# Patient Record
Sex: Female | Born: 1990 | Race: White | Hispanic: No | Marital: Married | Smoking: Current every day smoker
Health system: Southern US, Community
[De-identification: ages and names within clinical notes are randomized; demographics above are authoritative.]

## PROBLEM LIST (undated history)

## (undated) DIAGNOSIS — I10 Essential (primary) hypertension: Secondary | ICD-10-CM

## (undated) DIAGNOSIS — K219 Gastro-esophageal reflux disease without esophagitis: Secondary | ICD-10-CM

## (undated) DIAGNOSIS — J45909 Unspecified asthma, uncomplicated: Secondary | ICD-10-CM

## (undated) DIAGNOSIS — N200 Calculus of kidney: Secondary | ICD-10-CM

## (undated) DIAGNOSIS — D649 Anemia, unspecified: Secondary | ICD-10-CM

## (undated) DIAGNOSIS — F410 Panic disorder [episodic paroxysmal anxiety] without agoraphobia: Secondary | ICD-10-CM

## (undated) DIAGNOSIS — Z87442 Personal history of urinary calculi: Secondary | ICD-10-CM

## (undated) HISTORY — PX: CHOLECYSTECTOMY: SHX55

## (undated) HISTORY — PX: KIDNEY STONE SURGERY: SHX686

---
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
## 2010-01-06 ENCOUNTER — Emergency Department: Payer: Self-pay | Admitting: Emergency Medicine

## 2010-02-12 ENCOUNTER — Emergency Department: Payer: Self-pay | Admitting: Emergency Medicine

## 2010-02-16 ENCOUNTER — Emergency Department: Payer: Self-pay | Admitting: Emergency Medicine

## 2010-03-29 ENCOUNTER — Emergency Department: Payer: Self-pay | Admitting: Emergency Medicine

## 2010-04-20 ENCOUNTER — Emergency Department: Payer: Self-pay | Admitting: Unknown Physician Specialty

## 2010-04-23 ENCOUNTER — Emergency Department: Payer: Self-pay | Admitting: Emergency Medicine

## 2010-05-19 ENCOUNTER — Observation Stay: Payer: Self-pay | Admitting: Obstetrics & Gynecology

## 2010-07-30 ENCOUNTER — Observation Stay: Payer: Self-pay | Admitting: Obstetrics and Gynecology

## 2010-08-17 ENCOUNTER — Encounter: Payer: Self-pay | Admitting: Obstetrics & Gynecology

## 2010-08-18 ENCOUNTER — Encounter: Payer: Self-pay | Admitting: Obstetrics & Gynecology

## 2010-09-11 ENCOUNTER — Ambulatory Visit: Payer: Self-pay | Admitting: Obstetrics and Gynecology

## 2010-09-12 ENCOUNTER — Inpatient Hospital Stay: Payer: Self-pay | Admitting: Obstetrics and Gynecology

## 2010-10-19 ENCOUNTER — Emergency Department: Payer: Self-pay | Admitting: Emergency Medicine

## 2010-11-11 ENCOUNTER — Emergency Department: Payer: Self-pay | Admitting: Emergency Medicine

## 2010-11-18 ENCOUNTER — Emergency Department: Payer: Self-pay | Admitting: Emergency Medicine

## 2010-12-09 NOTE — Assessment & Plan Note (Signed)
Elizabeth Shea, Elizabeth Shea                   ACCOUNT NO.:  [id number]   MEDICAL RECORD NO.:  [id number]          PATIENT TYPE:  POB   LOCATION:  [facility] at [facility]         FACILITY:  [facility]   PHYSICIAN:  Johnella Moloney, MD        DATE OF BIRTH:  February 23, 1991   DATE OF SERVICE:  04/17/2008                                  CLINIC NOTE   CHIEF COMPLAINT:  Dysfunctional uterine bleeding, abdominal pain.   HISTORY OF PRESENT ILLNESS:  The patient is a 20 year old gravida 2,  para 2-0-0-2 with a last menstrual period of April 10, 2008, who is  here for two main issues.  Issue #1 is dysfunctional uterine bleeding.  The patient reports having menarche at age 46 and has had regular  menstrual periods excluding the time that she was pregnant.  However,  about 2 weeks ago, she noted that she was having bleeding only in the  morning for 1 week, which goes away by the middle of the day.  The  bleeding is kind of like a period.  This is not part of her regular  menstrual bleeding.  Of note, the patient is on Loestrin 24 for birth  control and has been on this since her last delivery 5 months ago.  She  also states that this bleeding is associated with some nausea and  vomiting and also some pelvic pain.  The patient is interested in  changing her Loestrin birth control method and is interested in the  Implanon.  Next issue is abdominal pain.  The patient does report that  sometimes when she eats, she feels very sick and feels very dizzy and  then she gets a panic attack, which leads her to get cramping.  She was  evaluated in [facility] for this and had an extensive amount of tests  done including a Pap smear, STD testing, and other modalities, which  were all negative.  The patient denied any gastroenterology workup or  psychiatric workup given her history of panic attacks.  The patient does  state that she will be seeing a psychotherapist in about a week because  she feels like her nerves are causing her  stomach pain.  The patient  is requesting an ultrasound just to make sure that everything is okay  because this was not done in [place].  The patient is sexually  active with the father of her baby does not report any problems, no  sexually transmitted infections, or any other concerns.  The patient did  start the Gardasil vaccine series prior to pregnancy, only received one  shot, and is interested in restarting the series.   PAST OB/GYN HISTORY:  Menarche at age 20.  Regular menstrual cycles with  30 days between cycles.  Her periods last for 7 days.  No intermenstrual  bleedings apart from what happened 2 weeks ago.  Her period usually have  mild pain associated with it.  The patient has had 2 cesarean sections.  Her last pregnancy was 7 months ago.  She is currently on Loestrin 24  for birth control.  The patient states that her last Pap smear  was on  April 11, 2008 in [place].  She denies any abnormal Pap smears.  The patient denies any sexually transmitted infections.   PAST MEDICAL HISTORY:  Asthma, bipolar disease, ADHD, and anxiety  attacks.  The patient is not on any medications for her psychiatric  issues.   PAST SURGICAL HISTORY:  Cesarean section x2.   MEDICATIONS:  Loestrin 24 Fe 1 tablet p.o. q.d. and metronidazole 500 mg  p.o. q.d.  The patient stated that she was given this at [facility] for  treatment for possible bacterial vaginosis.   ALLERGIES:  No known drug allergies.   SOCIAL HISTORY:  The patient lives with her children and with her sister  and mother.  She does not work.  She smokes half a pack a day and has  smoked for 2 years.  She does not drink any alcohol.  She has used  addicting drugs in the past and did not divulge with drugs she used, but  is not currently using any drugs according to her.  She denies any  history of sexual or physical abuse.   FAMILY HISTORY:  The patient reports that her maternal grandmother had  breast cancer at an old  age and her mother has high blood pressure.   REVIEW OF SYSTEMS:  She endorses weight loss, dizzy spells, problems of  breathing, nausea, vomiting, and vaginal bleeding.   PHYSICAL EXAMINATION:  VITAL SIGNS:  Blood pressure is 91/68, pulse 68,  weight 153 pounds, height 5 feet 3 inches.  GENERAL:  No apparent distress.  BREASTS:  There was a 7-mm mobile mass that was palpated at 10 o'clock  position of her right breast.  This mass is nontender and it is smooth.  No other masses palpated on the right breast.  No skin changes.  No  lymphadenopathy.  No abnormal drainage.  Normal examination on the  patient's left breast.  LUNGS:  Clear to auscultation bilaterally.  HEART:  Regular rate and rhythm.  ABDOMEN:  Nontender, nondistended.  The patient has two transverse  incisions, one Pfannenstiel  and the one slightly above it, which are  well healed.  EXTREMITIES:  No cyanosis, clubbing, or edema.  PELVIC:  Normal external female genitalia.  Speculum exam was deferred.  On bimanual exam, small retroverted uterus.  No tenderness.  No adnexal  masses or tenderness elicited on examination.   ASSESSMENT AND PLAN:  The patient is a 20 year old gravida 2, para 2,  here with two complaints.  For her dysfunctional uterine bleeding, this  is likely a side effect of her birth control pills.  The patient is  interested in the Implanon.  She was given information to review at home  for the Implanon.  Also, given information for Mirena IUD.  The patient  was also given information for Gardasil and encouraged to make an  appointment to complete the series.  An appointment will be made for her  for Implanon insertion in this clinic.  As for her pelvic pain, the  patient attributes this to her nerves.  There is no etiology that was  found on examination.  We will schedule a pelvic ultrasound just for  confirmation of normal pelvic anatomy as per patient request.  The  patient was told that if these  symptoms continue, she will need  psychiatric evaluation versus gastroenterology workup for irritable  bowel syndrome.  The patient was also noted to have a  breast mass on  examination.  This is likely  benign.  It is likely a fibroadenoma versus  clogged duct.  That said, a breast ultrasound will be ordered just for  further evaluation and characterization of this abnormal mass.  We will  follow up the results.  The patient will follow up for her Implanon  insertion and discussion of all these results and for followup of her  symptoms.           ______________________________  Johnella Moloney, MD     UD/MEDQ  D:  04/17/2008  T:  04/18/2008  Job:  [id number]

## 2011-01-30 ENCOUNTER — Emergency Department: Payer: Self-pay | Admitting: Emergency Medicine

## 2011-02-04 ENCOUNTER — Emergency Department: Payer: Self-pay | Admitting: Emergency Medicine

## 2011-04-13 ENCOUNTER — Emergency Department: Payer: Self-pay | Admitting: Unknown Physician Specialty

## 2011-05-31 ENCOUNTER — Emergency Department: Payer: Self-pay | Admitting: Emergency Medicine

## 2011-06-27 ENCOUNTER — Emergency Department: Payer: Self-pay | Admitting: Emergency Medicine

## 2011-10-21 ENCOUNTER — Ambulatory Visit: Payer: Self-pay | Admitting: Obstetrics and Gynecology

## 2011-11-14 ENCOUNTER — Emergency Department: Payer: Self-pay | Admitting: Internal Medicine

## 2011-11-14 LAB — URINALYSIS, COMPLETE
Glucose,UR: NEGATIVE mg/dL (ref 0–75)
Ketone: NEGATIVE
Nitrite: NEGATIVE
Ph: 6 (ref 4.5–8.0)
Protein: 30
RBC,UR: 97 /HPF (ref 0–5)
Specific Gravity: 1.018 (ref 1.003–1.030)
Squamous Epithelial: 586
WBC UR: 41 /HPF (ref 0–5)

## 2011-11-14 LAB — BASIC METABOLIC PANEL
BUN: 11 mg/dL (ref 7–18)
Calcium, Total: 9 mg/dL (ref 8.5–10.1)
Chloride: 106 mmol/L (ref 98–107)
Co2: 25 mmol/L (ref 21–32)
Creatinine: 0.68 mg/dL (ref 0.60–1.30)
EGFR (African American): >60
EGFR (Non-African Amer.): >60
Glucose: 74 mg/dL (ref 65–99)
Osmolality: 277 (ref 275–301)
Potassium: 4 mmol/L (ref 3.5–5.1)

## 2011-11-14 LAB — CBC WITH DIFFERENTIAL/PLATELET
Basophil %: 0.4 %
Eosinophil #: 0.2 10*3/uL (ref 0.0–0.7)
Eosinophil %: 2.2 %
HGB: 14 g/dL (ref 12.0–16.0)
Lymphocyte #: 2.7 10*3/uL (ref 1.0–3.6)
MCHC: 33.5 g/dL (ref 32.0–36.0)
MCV: 93 fL (ref 80–100)
Neutrophil #: 5.4 10*3/uL (ref 1.4–6.5)
Neutrophil %: 59.9 %
Platelet: 192 10*3/uL (ref 150–440)

## 2011-11-21 ENCOUNTER — Emergency Department: Payer: Self-pay | Admitting: *Deleted

## 2012-01-20 ENCOUNTER — Emergency Department: Payer: Self-pay

## 2012-01-20 LAB — URINALYSIS, COMPLETE
Bacteria: NONE SEEN
Bilirubin,UR: NEGATIVE
Glucose,UR: NEGATIVE mg/dL (ref 0–75)
Renal Epithelial: 1
WBC UR: 2 /HPF (ref 0–5)

## 2012-01-20 LAB — COMPREHENSIVE METABOLIC PANEL
Albumin: 4.2 g/dL (ref 3.4–5.0)
Anion Gap: 4 — ABNORMAL LOW (ref 7–16)
Bilirubin,Total: 0.4 mg/dL (ref 0.2–1.0)
Calcium, Total: 9.3 mg/dL (ref 8.5–10.1)
Chloride: 106 mmol/L (ref 98–107)
Co2: 27 mmol/L (ref 21–32)
Creatinine: 0.82 mg/dL (ref 0.60–1.30)
EGFR (African American): >60
EGFR (Non-African Amer.): >60
Glucose: 115 mg/dL — ABNORMAL HIGH (ref 65–99)
Osmolality: 276 (ref 275–301)
Potassium: 4 mmol/L (ref 3.5–5.1)
Sodium: 137 mmol/L (ref 136–145)
Total Protein: 7.8 g/dL (ref 6.4–8.2)

## 2012-01-20 LAB — CSF CELL CT + PROT + GLU PANEL
Eosinophil: 0 %
Glucose, CSF: 67 mg/dL (ref 40–75)
Lymphocytes: 80 %
Monocytes/Macrophages: 20 %
Neutrophils: 0 %
Other Cells: 0 %
RBC (CSF): 5 /mm3

## 2012-01-20 LAB — CSF CELL COUNT WITH DIFFERENTIAL
CSF Tube #: 3
Eosinophil: 0 %
Monocytes/Macrophages: 0 %
Neutrophils: 0 %
RBC (CSF): 18 /mm3

## 2012-01-20 LAB — CBC
HCT: 42.3 % (ref 35.0–47.0)
MCH: 31.4 pg (ref 26.0–34.0)
MCV: 92 fL (ref 80–100)
Platelet: 207 10*3/uL (ref 150–440)
RBC: 4.61 10*6/uL (ref 3.80–5.20)
RDW: 12.8 % (ref 11.5–14.5)

## 2012-01-20 LAB — DRUG SCREEN, URINE
Amphetamines, Ur Screen: NEGATIVE (ref ?–1000)
Barbiturates, Ur Screen: NEGATIVE (ref ?–200)
Benzodiazepine, Ur Scrn: NEGATIVE (ref ?–200)
Cannabinoid 50 Ng, Ur ~~LOC~~: POSITIVE (ref ?–50)
Cocaine Metabolite,Ur ~~LOC~~: NEGATIVE (ref ?–300)
Tricyclic, Ur Screen: NEGATIVE (ref ?–1000)

## 2012-01-20 LAB — LIPASE, BLOOD: Lipase: 85 U/L (ref 73–393)

## 2012-01-20 LAB — ETHANOL
Ethanol %: <0.003 % (ref 0.000–0.080)
Ethanol: <3 mg/dL

## 2012-01-20 LAB — TSH: Thyroid Stimulating Horm: 1.95 u[IU]/mL

## 2012-01-20 LAB — PREGNANCY, URINE: Pregnancy Test, Urine: NEGATIVE m[IU]/mL

## 2012-01-29 ENCOUNTER — Ambulatory Visit: Payer: Self-pay | Admitting: Neurology

## 2012-03-07 ENCOUNTER — Emergency Department: Payer: Self-pay | Admitting: Emergency Medicine

## 2012-03-07 LAB — URINALYSIS, COMPLETE
Glucose,UR: NEGATIVE mg/dL (ref 0–75)
Nitrite: NEGATIVE
Ph: 5 (ref 4.5–8.0)

## 2012-03-09 LAB — URINE CULTURE

## 2012-03-10 ENCOUNTER — Emergency Department: Payer: Self-pay | Admitting: Emergency Medicine

## 2012-03-10 LAB — URINALYSIS, COMPLETE
Ketone: NEGATIVE
Nitrite: NEGATIVE
Ph: 8 (ref 4.5–8.0)
Protein: 100
Specific Gravity: 1.018 (ref 1.003–1.030)
Transitional Epi: 5

## 2012-04-08 ENCOUNTER — Emergency Department: Payer: Self-pay | Admitting: Emergency Medicine

## 2012-04-08 LAB — CBC WITH DIFFERENTIAL/PLATELET
Basophil #: 0.1 10*3/uL (ref 0.0–0.1)
Eosinophil #: 0.1 10*3/uL (ref 0.0–0.7)
HCT: 36.5 % (ref 35.0–47.0)
HGB: 13 g/dL (ref 12.0–16.0)
MCH: 32.3 pg (ref 26.0–34.0)
MCV: 91 fL (ref 80–100)
Neutrophil #: 6.1 10*3/uL (ref 1.4–6.5)
RBC: 4.02 10*6/uL (ref 3.80–5.20)

## 2012-04-08 LAB — BASIC METABOLIC PANEL
Anion Gap: 12 (ref 7–16)
Calcium, Total: 8.5 mg/dL (ref 8.5–10.1)
Chloride: 110 mmol/L — ABNORMAL HIGH (ref 98–107)
Co2: 23 mmol/L (ref 21–32)
Creatinine: 0.65 mg/dL (ref 0.60–1.30)

## 2012-06-20 ENCOUNTER — Emergency Department: Payer: Self-pay | Admitting: Unknown Physician Specialty

## 2012-06-20 LAB — COMPREHENSIVE METABOLIC PANEL
Albumin: 4 g/dL (ref 3.4–5.0)
Alkaline Phosphatase: 89 U/L (ref 50–136)
Bilirubin,Total: 0.4 mg/dL (ref 0.2–1.0)
Chloride: 107 mmol/L (ref 98–107)
Glucose: 81 mg/dL (ref 65–99)
Osmolality: 277 (ref 275–301)
Potassium: 3.8 mmol/L (ref 3.5–5.1)
Total Protein: 7.4 g/dL (ref 6.4–8.2)

## 2012-06-20 LAB — URINALYSIS, COMPLETE
Glucose,UR: NEGATIVE mg/dL (ref 0–75)
Ketone: NEGATIVE
Nitrite: NEGATIVE
WBC UR: 4 /HPF (ref 0–5)

## 2012-06-20 LAB — CBC
MCV: 91 fL (ref 80–100)
Platelet: 221 10*3/uL (ref 150–440)
RBC: 4.31 10*6/uL (ref 3.80–5.20)
WBC: 9.8 10*3/uL (ref 3.6–11.0)

## 2012-06-21 LAB — LIPASE, BLOOD: Lipase: 98 U/L (ref 73–393)

## 2012-10-09 ENCOUNTER — Emergency Department: Payer: Self-pay | Admitting: Emergency Medicine

## 2012-10-23 ENCOUNTER — Emergency Department: Payer: Self-pay | Admitting: Emergency Medicine

## 2013-04-21 ENCOUNTER — Emergency Department: Payer: Self-pay | Admitting: Emergency Medicine

## 2013-04-21 LAB — URINALYSIS, COMPLETE
Bilirubin,UR: NEGATIVE
Glucose,UR: NEGATIVE mg/dL (ref 0–75)
Leukocyte Esterase: NEGATIVE
Nitrite: NEGATIVE
RBC,UR: 18 /HPF (ref 0–5)
Specific Gravity: 1.017 (ref 1.003–1.030)
Squamous Epithelial: 4
WBC UR: 1 /HPF (ref 0–5)

## 2013-11-17 ENCOUNTER — Emergency Department: Payer: Self-pay | Admitting: Emergency Medicine

## 2013-11-17 LAB — CBC
HCT: 43 % (ref 35.0–47.0)
HGB: 14.5 g/dL (ref 12.0–16.0)
MCH: 31.3 pg (ref 26.0–34.0)
MCHC: 33.7 g/dL (ref 32.0–36.0)
MCV: 93 fL (ref 80–100)
PLATELETS: 219 10*3/uL (ref 150–440)
RBC: 4.62 10*6/uL (ref 3.80–5.20)
RDW: 13.8 % (ref 11.5–14.5)
WBC: 11.8 10*3/uL — ABNORMAL HIGH (ref 3.6–11.0)

## 2013-11-17 LAB — WET PREP, GENITAL

## 2013-11-17 LAB — HCG, QUANTITATIVE, PREGNANCY: BETA HCG, QUANT.: 37 m[IU]/mL — AB

## 2014-06-21 ENCOUNTER — Emergency Department: Payer: Self-pay | Admitting: Emergency Medicine

## 2014-07-05 ENCOUNTER — Emergency Department: Payer: Self-pay | Admitting: Student

## 2014-07-26 ENCOUNTER — Emergency Department: Payer: Self-pay | Admitting: Emergency Medicine

## 2014-07-26 LAB — BASIC METABOLIC PANEL
Anion Gap: 9 (ref 7–16)
BUN: 14 mg/dL (ref 7–18)
Calcium, Total: 8.7 mg/dL (ref 8.5–10.1)
Chloride: 112 mmol/L — ABNORMAL HIGH (ref 98–107)
Co2: 20 mmol/L — ABNORMAL LOW (ref 21–32)
Creatinine: 0.71 mg/dL (ref 0.60–1.30)
EGFR (African American): >60
GLUCOSE: 100 mg/dL — AB (ref 65–99)
Osmolality: 282 (ref 275–301)
Potassium: 3.9 mmol/L (ref 3.5–5.1)
SODIUM: 141 mmol/L (ref 136–145)

## 2014-07-26 LAB — CBC WITH DIFFERENTIAL/PLATELET
Basophil #: 0 10*3/uL (ref 0.0–0.1)
Basophil %: 0.3 %
EOS ABS: 0 10*3/uL (ref 0.0–0.7)
Eosinophil %: 0.3 %
HCT: 44.3 % (ref 35.0–47.0)
HGB: 14.7 g/dL (ref 12.0–16.0)
LYMPHS ABS: 2.1 10*3/uL (ref 1.0–3.6)
Lymphocyte %: 19.9 %
MCH: 30.8 pg (ref 26.0–34.0)
MCHC: 33.2 g/dL (ref 32.0–36.0)
MCV: 93 fL (ref 80–100)
MONO ABS: 0.5 x10 3/mm (ref 0.2–0.9)
Monocyte %: 4.3 %
NEUTROS PCT: 75.2 %
Neutrophil #: 8 10*3/uL — ABNORMAL HIGH (ref 1.4–6.5)
Platelet: 229 10*3/uL (ref 150–440)
RBC: 4.77 10*6/uL (ref 3.80–5.20)
RDW: 12.6 % (ref 11.5–14.5)
WBC: 10.6 10*3/uL (ref 3.6–11.0)

## 2014-07-26 LAB — URINALYSIS, COMPLETE
Bacteria: NONE SEEN
Bilirubin,UR: NEGATIVE
Glucose,UR: NEGATIVE mg/dL (ref 0–75)
Ketone: NEGATIVE
Leukocyte Esterase: NEGATIVE
Nitrite: NEGATIVE
PH: 6 (ref 4.5–8.0)
Protein: NEGATIVE
RBC,UR: 545 /HPF (ref 0–5)
SPECIFIC GRAVITY: 1.02 (ref 1.003–1.030)
Squamous Epithelial: 2
WBC UR: NONE SEEN /HPF (ref 0–5)

## 2014-07-26 LAB — TROPONIN I: Troponin-I: <0.02 ng/mL

## 2014-10-12 ENCOUNTER — Emergency Department: Payer: Self-pay | Admitting: Emergency Medicine

## 2014-12-03 ENCOUNTER — Encounter: Payer: Self-pay | Admitting: *Deleted

## 2014-12-03 DIAGNOSIS — Z3202 Encounter for pregnancy test, result negative: Secondary | ICD-10-CM | POA: Diagnosis not present

## 2014-12-03 DIAGNOSIS — S80211A Abrasion, right knee, initial encounter: Secondary | ICD-10-CM | POA: Insufficient documentation

## 2014-12-03 DIAGNOSIS — Z720 Tobacco use: Secondary | ICD-10-CM | POA: Insufficient documentation

## 2014-12-03 DIAGNOSIS — Y998 Other external cause status: Secondary | ICD-10-CM | POA: Diagnosis not present

## 2014-12-03 DIAGNOSIS — S80212A Abrasion, left knee, initial encounter: Secondary | ICD-10-CM | POA: Diagnosis not present

## 2014-12-03 DIAGNOSIS — S0081XA Abrasion of other part of head, initial encounter: Secondary | ICD-10-CM | POA: Diagnosis not present

## 2014-12-03 DIAGNOSIS — S60511A Abrasion of right hand, initial encounter: Secondary | ICD-10-CM | POA: Insufficient documentation

## 2014-12-03 DIAGNOSIS — S60512A Abrasion of left hand, initial encounter: Secondary | ICD-10-CM | POA: Diagnosis not present

## 2014-12-03 DIAGNOSIS — W1839XA Other fall on same level, initial encounter: Secondary | ICD-10-CM | POA: Diagnosis not present

## 2014-12-03 DIAGNOSIS — S70211A Abrasion, right hip, initial encounter: Secondary | ICD-10-CM | POA: Insufficient documentation

## 2014-12-03 DIAGNOSIS — S0990XA Unspecified injury of head, initial encounter: Secondary | ICD-10-CM | POA: Diagnosis present

## 2014-12-03 DIAGNOSIS — Y9289 Other specified places as the place of occurrence of the external cause: Secondary | ICD-10-CM | POA: Diagnosis not present

## 2014-12-03 DIAGNOSIS — Y9389 Activity, other specified: Secondary | ICD-10-CM | POA: Diagnosis not present

## 2014-12-03 DIAGNOSIS — S3992XA Unspecified injury of lower back, initial encounter: Secondary | ICD-10-CM | POA: Diagnosis not present

## 2014-12-03 DIAGNOSIS — S30811A Abrasion of abdominal wall, initial encounter: Secondary | ICD-10-CM | POA: Insufficient documentation
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
## 2014-12-04 ENCOUNTER — Emergency Department
Admission: EM | Admit: 2014-12-04 | Discharge: 2014-12-04 | Disposition: A | Discharge status: Home / Self Care | Payer: Medicaid Other | Attending: Emergency Medicine | Admitting: Emergency Medicine

## 2014-12-04 ENCOUNTER — Encounter: Payer: Self-pay | Admitting: Emergency Medicine

## 2014-12-04 ENCOUNTER — Emergency Department: Payer: Medicaid Other

## 2014-12-04 DIAGNOSIS — T07XXXA Unspecified multiple injuries, initial encounter: Secondary | ICD-10-CM

## 2014-12-04 DIAGNOSIS — M7918 Myalgia, other site: Secondary | ICD-10-CM

## 2014-12-04 HISTORY — DX: Unspecified asthma, uncomplicated: J45.909

## 2014-12-04 LAB — URINALYSIS COMPLETE WITH MICROSCOPIC (ARMC ONLY)
Bilirubin Urine: NEGATIVE
Glucose, UA: NEGATIVE mg/dL
Hgb urine dipstick: NEGATIVE
LEUKOCYTES UA: NEGATIVE
NITRITE: NEGATIVE
PH: 7 (ref 5.0–8.0)
PROTEIN: NEGATIVE mg/dL
RBC / HPF: NONE SEEN RBC/hpf (ref 0–5)
SPECIFIC GRAVITY, URINE: 1.01 (ref 1.005–1.030)

## 2014-12-04 LAB — BASIC METABOLIC PANEL
ANION GAP: 8 (ref 5–15)
BUN: 15 mg/dL (ref 6–20)
CHLORIDE: 108 mmol/L (ref 101–111)
CO2: 23 mmol/L (ref 22–32)
Calcium: 9.3 mg/dL (ref 8.9–10.3)
Creatinine, Ser: 0.7 mg/dL (ref 0.44–1.00)
GFR calc Af Amer: >60 mL/min (ref >60)
GFR calc non Af Amer: >60 mL/min (ref >60)
Glucose, Bld: 88 mg/dL (ref 65–99)
Potassium: 3.7 mmol/L (ref 3.5–5.1)
Sodium: 139 mmol/L (ref 135–145)

## 2014-12-04 LAB — CBC
HCT: 42.8 % (ref 35.0–47.0)
Hemoglobin: 14 g/dL (ref 12.0–16.0)
MCH: 30.8 pg (ref 26.0–34.0)
MCHC: 32.8 g/dL (ref 32.0–36.0)
MCV: 93.8 fL (ref 80.0–100.0)
Platelets: 233 10*3/uL (ref 150–440)
RBC: 4.56 MIL/uL (ref 3.80–5.20)
RDW: 13.2 % (ref 11.5–14.5)
WBC: 14.3 10*3/uL — AB (ref 3.6–11.0)

## 2014-12-04 LAB — HCG, QUANTITATIVE, PREGNANCY: hCG, Beta Chain, Quant, S: <1 m[IU]/mL (ref <5)

## 2014-12-04 MED ORDER — ONDANSETRON HCL 4 MG/2ML IJ SOLN
INTRAMUSCULAR | Status: AC
  Filled 2014-12-04: qty 2

## 2014-12-04 MED ORDER — OXYCODONE-ACETAMINOPHEN 5-325 MG PO TABS
1.0000 | ORAL_TABLET | Freq: Once | ORAL | Status: AC
  Administered 2014-12-04: 1 via ORAL

## 2014-12-04 MED ORDER — MORPHINE SULFATE 4 MG/ML IJ SOLN
4.0000 mg | Freq: Once | INTRAMUSCULAR | Status: AC
  Administered 2014-12-04: 4 mg via INTRAVENOUS

## 2014-12-04 MED ORDER — IOHEXOL 300 MG/ML  SOLN
100.0000 mL | Freq: Once | INTRAMUSCULAR | Status: AC | PRN
  Administered 2014-12-04: 100 mL via INTRAVENOUS

## 2014-12-04 MED ORDER — OXYCODONE-ACETAMINOPHEN 5-325 MG PO TABS
ORAL_TABLET | ORAL | Status: AC
  Administered 2014-12-04: 1 via ORAL
  Filled 2014-12-04: qty 1

## 2014-12-04 MED ORDER — MORPHINE SULFATE 4 MG/ML IJ SOLN
INTRAMUSCULAR | Status: AC
  Administered 2014-12-04: 4 mg via INTRAVENOUS
  Filled 2014-12-04: qty 1

## 2014-12-04 MED ORDER — SODIUM CHLORIDE 0.9 % IV BOLUS (SEPSIS): 1000.0000 mL | Freq: Once | INTRAVENOUS | Status: DC

## 2014-12-04 MED ORDER — ONDANSETRON HCL 4 MG/2ML IJ SOLN
4.0000 mg | Freq: Once | INTRAMUSCULAR | Status: AC
  Administered 2014-12-04: 4 mg via INTRAVENOUS

## 2014-12-04 MED ORDER — OXYCODONE-ACETAMINOPHEN 5-325 MG PO TABS: 1.0000 | ORAL_TABLET | Freq: Four times a day (QID) | ORAL | Status: DC | PRN

## 2014-12-04 NOTE — Discharge Instructions (Signed)
Abrasion An abrasion is a cut or scrape of the skin. Abrasions do not extend through all layers of the skin and most heal within 10 days. It is important to care for your abrasion properly to prevent infection. CAUSES  Most abrasions are caused by falling on, or gliding across, the ground or other surface. When your skin rubs on something, the outer and inner layer of skin rubs off, causing an abrasion. DIAGNOSIS  Your caregiver will be able to diagnose an abrasion during a physical exam.  TREATMENT  Your treatment depends on how large and deep the abrasion is. Generally, your abrasion will be cleaned with water and a mild soap to remove any dirt or debris. An antibiotic ointment may be put over the abrasion to prevent an infection. A bandage (dressing) may be wrapped around the abrasion to keep it from getting dirty.  You may need a tetanus shot if:  You cannot remember when you had your last tetanus shot.  You have never had a tetanus shot.  The injury broke your skin. If you get a tetanus shot, your arm may swell, get red, and feel warm to the touch. This is common and not a problem. If you need a tetanus shot and you choose not to have one, there is a rare chance of getting tetanus. Sickness from tetanus can be serious.  HOME CARE INSTRUCTIONS   If a dressing was applied, change it at least once a day or as directed by your caregiver. If the bandage sticks, soak it off with warm water.   Wash the area with water and a mild soap to remove all the ointment 2 times a day. Rinse off the soap and pat the area dry with a clean towel.   Reapply any ointment as directed by your caregiver. This will help prevent infection and keep the bandage from sticking. Use gauze over the wound and under the dressing to help keep the bandage from sticking.   Change your dressing right away if it becomes wet or dirty.   Only take over-the-counter or prescription medicines for pain, discomfort, or fever as  directed by your caregiver.   Follow up with your caregiver within 24-48 hours for a wound check, or as directed. If you were not given a wound-check appointment, look closely at your abrasion for redness, swelling, or pus. These are signs of infection. SEEK IMMEDIATE MEDICAL CARE IF:   You have increasing pain in the wound.   You have redness, swelling, or tenderness around the wound.   You have pus coming from the wound.   You have a fever or persistent symptoms for more than 2-3 days.  You have a fever and your symptoms suddenly get worse.  You have a bad smell coming from the wound or dressing.  MAKE SURE YOU:   Understand these instructions.  Will watch your condition.  Will get help right away if you are not doing well or get worse. Document Released: 04/22/2005 Document Revised: 06/29/2012 Document Reviewed: 06/16/2011 [facility] Patient Information 2015 [place], [place]. This information is not intended to replace advice given to you by your health care provider. Make sure you discuss any questions you have with your health care provider.  Muscle Pain Muscle pain (myalgia) may be caused by many things, including:  Overuse or muscle strain, especially if you are not in shape. This is the most common cause of muscle pain.  Injury.  Bruises.  Viruses, such as the flu.  Infectious  diseases.  Fibromyalgia, which is a chronic condition that causes muscle tenderness, fatigue, and headache.  Autoimmune diseases, including lupus.  Certain drugs, including ACE inhibitors and statins. Muscle pain may be mild or severe. In most cases, the pain lasts only a short time and goes away without treatment. To diagnose the cause of your muscle pain, your health care provider will take your medical history. This means he or she will ask you when your muscle pain began and what has been happening. If you have not had muscle pain for very long, your health care provider may want to  wait before doing much testing. If your muscle pain has lasted a long time, your health care provider may want to run tests right away. If your health care provider thinks your muscle pain may be caused by illness, you may need to have additional tests to rule out certain conditions.  Treatment for muscle pain depends on the cause. Home care is often enough to relieve muscle pain. Your health care provider may also prescribe anti-inflammatory medicine. HOME CARE INSTRUCTIONS Watch your condition for any changes. The following actions may help to lessen any discomfort you are feeling:  Only take over-the-counter or prescription medicines as directed by your health care provider.  Apply ice to the sore muscle:  Put ice in a plastic bag.  Place a towel between your skin and the bag.  Leave the ice on for 15-20 minutes, 3-4 times a day.  You may alternate applying hot and cold packs to the muscle as directed by your health care provider.  If overuse is causing your muscle pain, slow down your activities until the pain goes away.  Remember that it is normal to feel some muscle pain after starting a workout program. Muscles that have not been used often will be sore at first.  Do regular, gentle exercises if you are not usually active.  Warm up before exercising to lower your risk of muscle pain.  Do not continue working out if the pain is very bad. Bad pain could mean you have injured a muscle. SEEK MEDICAL CARE IF:  Your muscle pain gets worse, and medicines do not help.  You have muscle pain that lasts longer than 3 days.  You have a rash or fever along with muscle pain.  You have muscle pain after a tick bite.  You have muscle pain while working out, even though you are in good physical condition.  You have redness, soreness, or swelling along with muscle pain.  You have muscle pain after starting a new medicine or changing the dose of a medicine. SEEK IMMEDIATE MEDICAL CARE  IF:  You have trouble breathing.  You have trouble swallowing.  You have muscle pain along with a stiff neck, fever, and vomiting.  You have severe muscle weakness or cannot move part of your body. MAKE SURE YOU:   Understand these instructions.  Will watch your condition.  Will get help right away if you are not doing well or get worse. Document Released: 06/04/2006 Document Revised: 07/18/2013 Document Reviewed: 05/09/2013 [facility] Patient Information 2015 [place], [place]. This information is not intended to replace advice given to you by your health care provider. Make sure you discuss any questions you have with your health care provider.
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
## 2014-12-04 NOTE — ED Notes (Signed)
Pt verbalizes understanding of discharge instructions.

## 2015-04-09 ENCOUNTER — Emergency Department: Payer: Medicaid Other

## 2015-04-09 ENCOUNTER — Encounter: Payer: Self-pay | Admitting: Internal Medicine

## 2015-04-09 ENCOUNTER — Inpatient Hospital Stay
Admission: EM | Admit: 2015-04-09 | Discharge: 2015-04-11 | DRG: 100 | Disposition: A | Discharge status: Home / Self Care | Payer: Medicaid Other | Attending: Internal Medicine | Admitting: Internal Medicine

## 2015-04-09 DIAGNOSIS — T50901A Poisoning by unspecified drugs, medicaments and biological substances, accidental (unintentional), initial encounter: Secondary | ICD-10-CM | POA: Diagnosis present

## 2015-04-09 DIAGNOSIS — Z9911 Dependence on respirator [ventilator] status: Secondary | ICD-10-CM

## 2015-04-09 DIAGNOSIS — G92 Toxic encephalopathy: Secondary | ICD-10-CM | POA: Diagnosis present

## 2015-04-09 DIAGNOSIS — E876 Hypokalemia: Secondary | ICD-10-CM | POA: Diagnosis present

## 2015-04-09 DIAGNOSIS — J9600 Acute respiratory failure, unspecified whether with hypoxia or hypercapnia: Secondary | ICD-10-CM | POA: Diagnosis present

## 2015-04-09 DIAGNOSIS — R569 Unspecified convulsions: Secondary | ICD-10-CM | POA: Diagnosis present

## 2015-04-09 DIAGNOSIS — G9340 Encephalopathy, unspecified: Secondary | ICD-10-CM | POA: Diagnosis not present

## 2015-04-09 DIAGNOSIS — F17210 Nicotine dependence, cigarettes, uncomplicated: Secondary | ICD-10-CM | POA: Diagnosis present

## 2015-04-09 DIAGNOSIS — R410 Disorientation, unspecified: Secondary | ICD-10-CM

## 2015-04-09 DIAGNOSIS — Z79891 Long term (current) use of opiate analgesic: Secondary | ICD-10-CM

## 2015-04-09 DIAGNOSIS — G8929 Other chronic pain: Secondary | ICD-10-CM | POA: Diagnosis present

## 2015-04-09 DIAGNOSIS — M25569 Pain in unspecified knee: Secondary | ICD-10-CM | POA: Diagnosis present

## 2015-04-09 DIAGNOSIS — G40409 Other generalized epilepsy and epileptic syndromes, not intractable, without status epilepticus: Secondary | ICD-10-CM | POA: Diagnosis present

## 2015-04-09 DIAGNOSIS — J45909 Unspecified asthma, uncomplicated: Secondary | ICD-10-CM | POA: Diagnosis present

## 2015-04-09 DIAGNOSIS — Z79899 Other long term (current) drug therapy: Secondary | ICD-10-CM

## 2015-04-09 DIAGNOSIS — F1210 Cannabis abuse, uncomplicated: Secondary | ICD-10-CM

## 2015-04-09 DIAGNOSIS — F1910 Other psychoactive substance abuse, uncomplicated: Secondary | ICD-10-CM | POA: Diagnosis present

## 2015-04-09 DIAGNOSIS — F1110 Opioid abuse, uncomplicated: Secondary | ICD-10-CM

## 2015-04-09 DIAGNOSIS — Z01818 Encounter for other preprocedural examination: Secondary | ICD-10-CM

## 2015-04-09 LAB — COMPREHENSIVE METABOLIC PANEL
ALBUMIN: 4.6 g/dL (ref 3.5–5.0)
ALK PHOS: 89 U/L (ref 38–126)
ALT: 18 U/L (ref 14–54)
AST: 47 U/L — AB (ref 15–41)
Anion gap: 16 — ABNORMAL HIGH (ref 5–15)
BUN: 12 mg/dL (ref 6–20)
CALCIUM: 9.5 mg/dL (ref 8.9–10.3)
CO2: 14 mmol/L — ABNORMAL LOW (ref 22–32)
CREATININE: 0.8 mg/dL (ref 0.44–1.00)
Chloride: 108 mmol/L (ref 101–111)
GFR calc Af Amer: >60 mL/min (ref >60)
GLUCOSE: 127 mg/dL — AB (ref 65–99)
Potassium: 3.5 mmol/L (ref 3.5–5.1)
Sodium: 138 mmol/L (ref 135–145)
TOTAL PROTEIN: 8.1 g/dL (ref 6.5–8.1)
Total Bilirubin: 0.6 mg/dL (ref 0.3–1.2)

## 2015-04-09 LAB — BLOOD GAS, ARTERIAL
ALLENS TEST (PASS/FAIL): POSITIVE — AB
Acid-base deficit: 3.8 mmol/L — ABNORMAL HIGH (ref 0.0–2.0)
Acid-base deficit: 4.7 mmol/L — ABNORMAL HIGH (ref 0.0–2.0)
Allens test (pass/fail): POSITIVE — AB
BICARBONATE: 18.5 meq/L — AB (ref 21.0–28.0)
Bicarbonate: 20.4 mEq/L — ABNORMAL LOW (ref 21.0–28.0)
FIO2: 0.21
FIO2: 0.4
MECHANICAL RATE: 14
MECHVT: 450 mL
O2 Saturation: 98.8 %
O2 Saturation: 99.6 %
PCO2 ART: 26 mmHg — AB (ref 32.0–48.0)
PEEP: 5 cmH2O
PH ART: 7.46 — AB (ref 7.350–7.450)
PH ART: 7.35 (ref 7.350–7.450)
Patient temperature: 37
Patient temperature: 37
RATE: 14 resp/min
pCO2 arterial: 37 mmHg (ref 32.0–48.0)
pO2, Arterial: 117 mmHg — ABNORMAL HIGH (ref 83.0–108.0)
pO2, Arterial: 183 mmHg — ABNORMAL HIGH (ref 83.0–108.0)

## 2015-04-09 LAB — CBC WITH DIFFERENTIAL/PLATELET
Basophils Absolute: 0 10*3/uL (ref 0–0.1)
Basophils Relative: 0 %
EOS ABS: 0 10*3/uL (ref 0–0.7)
EOS PCT: 0 %
HCT: 47.2 % — ABNORMAL HIGH (ref 35.0–47.0)
Hemoglobin: 15.5 g/dL (ref 12.0–16.0)
Lymphocytes Relative: 14 %
Lymphs Abs: 3.1 10*3/uL (ref 1.0–3.6)
MCH: 30.7 pg (ref 26.0–34.0)
MCHC: 32.9 g/dL (ref 32.0–36.0)
MCV: 93.2 fL (ref 80.0–100.0)
MONO ABS: 0.9 10*3/uL (ref 0.2–0.9)
Monocytes Relative: 4 %
Neutro Abs: 18.4 10*3/uL — ABNORMAL HIGH (ref 1.4–6.5)
Neutrophils Relative %: 82 %
PLATELETS: 312 10*3/uL (ref 150–440)
RBC: 5.06 MIL/uL (ref 3.80–5.20)
RDW: 13.2 % (ref 11.5–14.5)
WBC: 22.4 10*3/uL — AB (ref 3.6–11.0)

## 2015-04-09 LAB — URINE DRUG SCREEN, QUALITATIVE (ARMC ONLY)
Amphetamines, Ur Screen: NOT DETECTED
Barbiturates, Ur Screen: NOT DETECTED
Benzodiazepine, Ur Scrn: POSITIVE — AB
Cannabinoid 50 Ng, Ur ~~LOC~~: POSITIVE — AB
Cocaine Metabolite,Ur ~~LOC~~: NOT DETECTED
MDMA (ECSTASY) UR SCREEN: NOT DETECTED
METHADONE SCREEN, URINE: POSITIVE — AB
Opiate, Ur Screen: POSITIVE — AB
Phencyclidine (PCP) Ur S: NOT DETECTED
TRICYCLIC, UR SCREEN: NOT DETECTED

## 2015-04-09 LAB — PREGNANCY, URINE: Preg Test, Ur: NEGATIVE

## 2015-04-09 LAB — SALICYLATE LEVEL: Salicylate Lvl: <4 mg/dL (ref 2.8–30.0)

## 2015-04-09 LAB — LACTIC ACID, PLASMA: LACTIC ACID, VENOUS: 1.2 mmol/L (ref 0.5–2.0)

## 2015-04-09 LAB — ETHANOL

## 2015-04-09 MED ORDER — MIDAZOLAM HCL 2 MG/2ML IJ SOLN
2.0000 mg | Freq: Once | INTRAMUSCULAR | Status: AC
  Administered 2015-04-09: 2 mg via INTRAVENOUS

## 2015-04-09 MED ORDER — MIDAZOLAM HCL 5 MG/5ML IJ SOLN
5.0000 mg | Freq: Once | INTRAMUSCULAR | Status: AC
  Filled 2015-04-09: qty 20

## 2015-04-09 MED ORDER — LORAZEPAM 2 MG/ML IJ SOLN
2.0000 mg | Freq: Once | INTRAMUSCULAR | Status: AC
  Administered 2015-04-09: 2 mg via INTRAVENOUS

## 2015-04-09 MED ORDER — MIDAZOLAM HCL 5 MG/5ML IJ SOLN
20.0000 mg | Freq: Once | INTRAMUSCULAR | Status: AC
  Administered 2015-04-09: 20 mg via INTRAVENOUS

## 2015-04-09 MED ORDER — FENTANYL CITRATE (PF) 100 MCG/2ML IJ SOLN
INTRAMUSCULAR | Status: AC
  Filled 2015-04-09: qty 2

## 2015-04-09 MED ORDER — SUCCINYLCHOLINE CHLORIDE 20 MG/ML IJ SOLN
INTRAMUSCULAR | Status: AC
  Administered 2015-04-09: 100 mg
  Filled 2015-04-09: qty 1

## 2015-04-09 MED ORDER — SODIUM CHLORIDE 0.9 % IJ SOLN
3.0000 mL | Freq: Two times a day (BID) | INTRAMUSCULAR | Status: DC
  Administered 2015-04-10 – 2015-04-11 (×2): 3 mL via INTRAVENOUS

## 2015-04-09 MED ORDER — HALOPERIDOL LACTATE 5 MG/ML IJ SOLN
5.0000 mg | Freq: Once | INTRAMUSCULAR | Status: AC
  Administered 2015-04-09: 5 mg via INTRAVENOUS

## 2015-04-09 MED ORDER — ETOMIDATE 2 MG/ML IV SOLN
INTRAVENOUS | Status: AC
  Administered 2015-04-09: 30 mg
  Filled 2015-04-09: qty 10

## 2015-04-09 MED ORDER — PROPOFOL 1000 MG/100ML IV EMUL
5.0000 ug/kg/min | INTRAVENOUS | Status: DC
  Administered 2015-04-09: 80 ug/kg/min via INTRAVENOUS
  Administered 2015-04-09: 100 ug/kg/min via INTRAVENOUS
  Administered 2015-04-10: 61.275 ug/kg/min via INTRAVENOUS
  Administered 2015-04-10: 50 ug/kg/min via INTRAVENOUS
  Filled 2015-04-09 (×3): qty 100

## 2015-04-09 MED ORDER — MIDAZOLAM HCL 5 MG/5ML IJ SOLN
INTRAMUSCULAR | Status: AC
  Administered 2015-04-09: 20 mg
  Filled 2015-04-09: qty 20

## 2015-04-09 MED ORDER — FENTANYL CITRATE (PF) 100 MCG/2ML IJ SOLN
100.0000 ug | Freq: Once | INTRAMUSCULAR | Status: AC
  Administered 2015-04-09: 100 ug via INTRAVENOUS

## 2015-04-09 MED ORDER — HEPARIN SODIUM (PORCINE) 5000 UNIT/ML IJ SOLN
5000.0000 [IU] | Freq: Three times a day (TID) | INTRAMUSCULAR | Status: DC
  Administered 2015-04-10 (×2): 5000 [IU] via SUBCUTANEOUS
  Filled 2015-04-09 (×2): qty 1

## 2015-04-09 MED ORDER — KETAMINE HCL 10 MG/ML IJ SOLN
1.0000 mg/kg | Freq: Once | INTRAMUSCULAR | Status: AC
  Administered 2015-04-09: 82 mg via INTRAVENOUS

## 2015-04-09 MED ORDER — MIDAZOLAM HCL 5 MG/5ML IJ SOLN
INTRAMUSCULAR | Status: AC
  Administered 2015-04-09: 2 mg via INTRAVENOUS
  Filled 2015-04-09: qty 5

## 2015-04-09 MED ORDER — KETAMINE HCL 50 MG/ML IJ SOLN
INTRAMUSCULAR | Status: AC
  Administered 2015-04-09: 50 mg
  Filled 2015-04-09: qty 10

## 2015-04-09 MED ORDER — ONDANSETRON HCL 4 MG PO TABS: 4.0000 mg | ORAL_TABLET | Freq: Four times a day (QID) | ORAL | Status: DC | PRN

## 2015-04-09 MED ORDER — VECURONIUM BROMIDE 10 MG IV SOLR
10.0000 mg | Freq: Once | INTRAVENOUS | Status: AC
  Administered 2015-04-09: 10 mg via INTRAVENOUS

## 2015-04-09 MED ORDER — SODIUM CHLORIDE 0.9 % IV SOLN
INTRAVENOUS | Status: DC
  Administered 2015-04-10: via INTRAVENOUS

## 2015-04-09 MED ORDER — ACETAMINOPHEN 650 MG RE SUPP: 650.0000 mg | Freq: Four times a day (QID) | RECTAL | Status: DC | PRN

## 2015-04-09 MED ORDER — ONDANSETRON HCL 4 MG/2ML IJ SOLN: 4.0000 mg | Freq: Four times a day (QID) | INTRAMUSCULAR | Status: DC | PRN

## 2015-04-09 MED ORDER — MIDAZOLAM HCL 5 MG/5ML IJ SOLN
INTRAMUSCULAR | Status: AC
  Administered 2015-04-09: 5 mg
  Filled 2015-04-09: qty 5

## 2015-04-09 MED ORDER — MIDAZOLAM HCL 5 MG/5ML IJ SOLN
2.0000 mg | Freq: Once | INTRAMUSCULAR | Status: AC
  Administered 2015-04-09: 2 mg via INTRAVENOUS

## 2015-04-09 MED ORDER — MORPHINE SULFATE (PF) 2 MG/ML IV SOLN: 2.0000 mg | INTRAVENOUS | Status: DC | PRN

## 2015-04-09 MED ORDER — PROPOFOL 1000 MG/100ML IV EMUL
5.0000 ug/kg/min | Freq: Once | INTRAVENOUS | Status: AC
  Filled 2015-04-09 (×2): qty 100

## 2015-04-09 MED ORDER — MIDAZOLAM HCL 5 MG/5ML IJ SOLN
INTRAMUSCULAR | Status: AC
  Filled 2015-04-09: qty 5

## 2015-04-09 MED ORDER — OXYCODONE HCL 5 MG PO TABS: 5.0000 mg | ORAL_TABLET | ORAL | Status: DC | PRN

## 2015-04-09 MED ORDER — ACETAMINOPHEN 325 MG PO TABS: 650.0000 mg | ORAL_TABLET | Freq: Four times a day (QID) | ORAL | Status: DC | PRN

## 2015-04-09 MED ORDER — MIDAZOLAM HCL 5 MG/5ML IJ SOLN
INTRAMUSCULAR | Status: AC
  Filled 2015-04-09: qty 15

## 2015-04-09 MED ORDER — MIDAZOLAM HCL 5 MG/5ML IJ SOLN
INTRAMUSCULAR | Status: AC
  Administered 2015-04-09: 2 mg
  Filled 2015-04-09: qty 5

## 2015-04-09 NOTE — ED Notes (Signed)
Less restless.  Continues to move all extremities.  Less combative.  Respirations regular and easy.  Continue to monitor.
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
## 2015-04-09 NOTE — ED Provider Notes (Signed)
Time Seen: Approximately patient was seen on arrival I have reviewed the triage notes  Chief Complaint: Altered Mental Status   History of Present Illness: Elizabeth Shea is a 24 y.o. female who presents via EMS. Patient had an ambulance called by her husband who states the patient has been combative and confused at home. He states that she's been like this. Most of the day. She apparently said history of same in speaking to her sister later the patient has had a previous history of what may be a seizure with extended postictal period her husband states he smoked some marijuana yesterday but he is not aware of any other illicit drugs. Patient's had a previous history of trauma. I cannot find any extensive hospital evaluation for similar circumstances per our records. Patient herself arrives combative with multiple people holding restraints. She is unable to offer any history or review of systems  Past Medical History  Diagnosis Date  . Asthma     Patient Active Problem List   Diagnosis Date Noted  . Acute encephalopathy 04/09/2015    Past Surgical History  Procedure Laterality Date  . Cholecystectomy      Past Surgical History  Procedure Laterality Date  . Cholecystectomy      Current Outpatient Rx  Name  Route  Sig  Dispense  Refill  . busPIRone (BUSPAR) 10 MG tablet   Oral   Take 10 mg by mouth 2 (two) times daily.         Marland Kitchen gabapentin (NEURONTIN) 300 MG capsule   Oral   Take 300 mg by mouth 2 (two) times daily.         Marland Kitchen oxyCODONE-acetaminophen (ROXICET) 5-325 MG per tablet   Oral   Take 1 tablet by mouth every 6 (six) hours as needed. Patient taking differently: Take 1 tablet by mouth every 6 (six) hours as needed for severe pain.    12 tablet   0     Allergies:  Review of patient's allergies indicates no known allergies.  Family History: Family History  Problem Relation Age of Onset  . Seizures Mother     Social History: Social History  Substance Use  Topics  . Smoking status: Current Every Day Smoker  . Smokeless tobacco: None  . Alcohol Use: No     Review of Systems:   10 point review of systems was performed and was otherwise negative: Review of systems mainly quiet from the family and past records Constitutional: No fever Eyes: No visual disturbances ENT: No sore throat, ear pain Cardiac: No chest pain Respiratory: No shortness of breath, wheezing, or stridor Abdomen: No abdominal pain, no vomiting, No diarrhea Endocrine: No weight loss, No night sweats Extremities: No peripheral edema, cyanosis Skin: No rashes, easy bruising Neurologic: No focal weakness, trouble with speech or swollowing Urologic: No dysuria, Hematuria, or urinary frequency  Physical Exam:  ED Triage Vitals  Enc Vitals Group     BP --      Pulse Rate 04/09/15 1726 109     Resp 04/09/15 1726 28     Temp 04/09/15 1652 97.9 F (36.6 C)     Temp Source 04/09/15 1652 Oral     SpO2 --      Weight 04/09/15 1628 180 lb (81.647 kg)     Height --      Head Cir --      Peak Flow --      Pain Score --  Pain Loc --      Pain Edu? --      Excl. in GC? --     General: Awake , Alert , and Oriented times 3; GCS 15 Head: Normal cephalic , atraumatic Eyes: Pupils equal , round, reactive to light Nose/Throat: No nasal drainage, patent upper airway without erythema or exudate.  Neck: Supple, Full range of motion, No anterior adenopathy or palpable thyroid masses Lungs: Clear to ascultation without wheezes , rhonchi, or rales Heart: Regular rate, regular rhythm without murmurs , gallops , or rubs Abdomen: Soft, non tender without rebound, guarding , or rigidity; bowel sounds positive and symmetric in all 4 quadrants. No organomegaly .        Extremities: 2 plus symmetric pulses. No edema, clubbing or cyanosis Neurologic: normal ambulation, Motor symmetric without deficits, sensory intact Skin: warm, dry, no rashes   Labs:   All laboratory work was  reviewed including any pertinent negatives or positives listed below:  Labs Reviewed  URINE DRUG SCREEN, QUALITATIVE ([facility] ONLY) - Abnormal; Notable for the following:    Opiate, Ur Screen POSITIVE (*)    Cannabinoid 50 Ng, Ur [place] POSITIVE (*)    Benzodiazepine, Ur Scrn POSITIVE (*)    Methadone Scn, Ur POSITIVE (*)    All other components within normal limits  COMPREHENSIVE METABOLIC PANEL - Abnormal; Notable for the following:    CO2 14 (*)    Glucose, Bld 127 (*)    AST 47 (*)    Anion gap 16 (*)    All other components within normal limits  CBC WITH DIFFERENTIAL/PLATELET - Abnormal; Notable for the following:    WBC 22.4 (*)    HCT 47.2 (*)    Neutro Abs 18.4 (*)    All other components within normal limits  BLOOD GAS, ARTERIAL - Abnormal; Notable for the following:    pH, Arterial 7.46 (*)    pCO2 arterial 26 (*)    pO2, Arterial 117 (*)    Bicarbonate 18.5 (*)    Acid-base deficit 3.8 (*)    Allens test (pass/fail) POSITIVE (*)    All other components within normal limits  ETHANOL  PREGNANCY, URINE  LACTIC ACID, PLASMA  SALICYLATE LEVEL  LACTIC ACID, PLASMA  BLOOD GAS, ARTERIAL  TRIGLYCERIDES  CBC  CREATININE, SERUM  TSH  BASIC METABOLIC PANEL  CBC  PROLACTIN  POC URINE PREG, ED   urine drug screen positive for multiple ingestions  Radiology:  I personally reviewed the radiologic studies   Procedures:  INTUBATION Performed by: Jennye Moccasin  Required items: required blood products, implants, devices, and special equipment available Patient identity confirmed: provided demographic data and hospital-assigned identification number Time out: Immediately prior to procedure a "time out" was called to verify the correct patient, procedure, equipment, support staff and site/side marked as required.  patient did have a crush injury to the tip of her tongue which may be seizure related IndicaSedation to rule out intracranial source for altered mental  status ntubation method: Direct laryngoscopic   Preoxygenation: BVM  Sedatives: 30 mg etomidate  Paralytic: 100 mgsu ccinylcholine  Tube 7.0  cuffed  Post-procedure assessment: chest rise and ETCO2 monitor Breath sounds: equal and absent over the epigastrium Tube secured with: ETT holder Chest x-ray interpreted by radiologist and me.  Chest x-ray findings:pending endotracheal tube in appropriate position  Patient tolerated the procedure well with no immediate complications.    Critical Care: CRITICAL CARE Performed by: Jennye Moccasin  Total critical care time: 77 minutes  Critical care time was exclusive of separately billable procedures and treating other patients.  Critical care was necessary to treat or prevent imminent or life-threatening deterioration.  Critical care was time spent personally by me on the following activities: development of treatment plan with patient and/or surrogate as well as nursing, discussions with consultants, evaluation of patient's response to treatment, examination of patient, obtaining history from patient or surrogate, ordering and performing treatments and interventions, ordering and review of laboratory studies, ordering and review of radiographic studies, pulse oximetry and re-evaluation of patient's condition. Multiple sedating drugs and evaluation for altered mental status with combative activity.    ED Course: The patient's altered mental status is felt was most likely secondary to her being postictal versus substance abuse. We had extreme difficulty in sedating the patient and was given multiple doses of benzodiazepine without succAltered mental status altered mental status altered mental statusessful resolution of her combativeness and altered mInpatient management and likely reduction of this sedatives and allow the patient to wake upental status. He was initially given 2 mg of IV Ativan along with 5 mg of Haldol with no effect.  Patient then received 2 doses of IV ketamine at 50 and 100 mg IV with some res I felt given her history and further conversations with her sister this is most likely postictal from a seizure given the elevated white blood cell count.olution of her combativeness so we are unable to try to get her through a CAT scan area. He was determined the patient require intubation with aggressive sedation including high doses of versed. Patient was orotracheally intubated using rapid sequence protocol.    Assessment:   Altered Mental Status Possible post ictal state  Final Clinical Impression:   Final diagnoses:  Delirium     Plan: The patient will be admitted to the intensive care unit.           Jennye Moccasin, MD 04/09/15 2202

## 2015-04-09 NOTE — H&P (Signed)
[facility] - [place] at [facility]   PATIENT NAME: Elizabeth Shea    MR#:  [id number]  DATE OF BIRTH:  08-01-1990   DATE OF ADMISSION:  04/09/2015  PRIMARY CARE PHYSICIAN: Evelene Croon, MD   REQUESTING/REFERRING PHYSICIAN: Huel Cote  CHIEF COMPLAINT:   Chief Complaint  Patient presents with  . Altered Mental Status    HISTORY OF PRESENT ILLNESS:  Elizabeth Shea  is a 24 y.o. female without significant medical history presenting with altered mental status. The patient is unable to provide any meaningful information given patient's mental status/medical condition. History obtained from patient's sister is present at bedside. States that she was in her usual state of health last evening however "smoked some weed" and has been altered today. The patient's husband states that he witnessed seizure activity 2 with associated altered mental status described as confusion. The family states that she has had a seizure once before back in 2013 she is not on any anti-epileptic drugs and workup was negative at that time. Emergency department course: Given marked agitation she was electively intubated by ED staff and placed on sedation  PAST MEDICAL HISTORY:   Past Medical History  Diagnosis Date  . Asthma     PAST SURGICAL HISTORY:   Past Surgical History  Procedure Laterality Date  . Cholecystectomy      SOCIAL HISTORY:   Social History  Substance Use Topics  . Smoking status: Current Every Day Smoker  . Smokeless tobacco: Not on file  . Alcohol Use: No    FAMILY HISTORY:   Family History  Problem Relation Age of Onset  . Seizures Mother     DRUG ALLERGIES:  No Known Allergies  REVIEW OF SYSTEMS:  Unable to obtain given patient's mental status/medical condition   MEDICATIONS AT HOME:   Prior to Admission medications   Medication Sig Start Date End Date Taking? Authorizing Provider  busPIRone (BUSPAR) 10 MG tablet Take 10 mg by mouth 2 (two)  times daily.   Yes Historical Provider, MD  gabapentin (NEURONTIN) 300 MG capsule Take 300 mg by mouth 2 (two) times daily.   Yes Historical Provider, MD  oxyCODONE-acetaminophen (ROXICET) 5-325 MG per tablet Take 1 tablet by mouth every 6 (six) hours as needed. Patient taking differently: Take 1 tablet by mouth every 6 (six) hours as needed for severe pain.  12/04/14 12/04/15 Yes Rebecka Apley, MD      VITAL SIGNS:  Pulse 109, resp. rate 28, weight 180 lb (81.647 kg).  PHYSICAL EXAMINATION:   VITAL SIGNS: Filed Vitals:   04/09/15 1726  Pulse: 109  Resp: 28   GENERAL:24 y.o.female moderate distress given mental status.  HEAD: Normocephalic, atraumatic.  EYES: Pupils equal, round, reactive to light. Unable to assess extraocular muscles given mental status/medical condition. No scleral icterus.  MOUTH: Moist mucosal membrane. Dentition intact. No abscess noted.  EAR, NOSE, THROAT: Clear without exudates. No external lesions.  NECK: Supple. No thyromegaly. No nodules. No JVD.  PULMONARY: Clear to ascultation, without wheeze rails or rhonci. No use of accessory muscles, Good respiratory effort breathing over ventilator. good air entry bilaterally currently mechanically ventilated CHEST: Nontender to palpation.  CARDIOVASCULAR: S1 and S2. Regular rate and rhythm. No murmurs, rubs, or gallops. No edema. Pedal pulses 2+ bilaterally.  GASTROINTESTINAL: Soft, nontender, nondistended. No masses. Positive bowel sounds. No hepatosplenomegaly.  MUSCULOSKELETAL: No swelling, clubbing, or edema. Range of motion full in all extremities.  NEUROLOGIC: Unable to assess given mental status/medical condition  SKIN: No ulceration, lesions, rashes, or cyanosis. Skin warm and dry. Turgor intact.  PSYCHIATRIC: Unable to assess given mental status/medical condition      LABORATORY PANEL:   CBC  Recent Labs Lab 04/09/15 1624  WBC 22.4*  HGB 15.5  HCT 47.2*  PLT 312    ------------------------------------------------------------------------------------------------------------------  Chemistries   Recent Labs Lab 04/09/15 1624  NA 138  K 3.5  CL 108  CO2 14*  GLUCOSE 127*  BUN 12  CREATININE 0.80  CALCIUM 9.5  AST 47*  ALT 18  ALKPHOS 89  BILITOT 0.6   ------------------------------------------------------------------------------------------------------------------  Cardiac Enzymes No results for input(s): TROPONINI in the last 168 hours. ------------------------------------------------------------------------------------------------------------------  RADIOLOGY:  Dg Chest Portable 1 View  04/09/2015   CLINICAL DATA:  Unresponsive.  Altered mental status.  EXAM: PORTABLE CHEST - 1 VIEW  COMPARISON:  12/04/2014  FINDINGS: Minimal motion degradation. Artifact degradation projects over the lung bases. Midline trachea. Normal heart size. No gross pleural fluid. No pneumothorax. Upper lungs are clear.  IMPRESSION: No acute cardiopulmonary disease.  Motion and artifact degradation over the lower chest.   Electronically Signed   By: Jeronimo Greaves M.D.   On: 04/09/2015 18:45    EKG:   Orders placed or performed in visit on 07/26/14  . EKG 12-Lead    IMPRESSION AND PLAN:   24 year old Caucasian female without significant past medical history presenting with altered mental status.  1. Acute encephalopathy: Patient was electively intubated and emergency department will continue propofol for sedation, wean accordingly. Noted multiple drugs on urine drug screen, Will consult neurology given concern for seizure activity, check prolactin, ideally will exudates by tomorrow and then can check an MRI brain 2. Respiratory failure/insufficiency: Continue mechanical ventilation wean FiO2 and PEEP as tolerated provide breathing treatments as required 3. Venous thromboembolism prophylactic: Heparin subcutaneous     All the records are reviewed and case  discussed with ED provider. Management plans discussed with the patient, family and they are in agreement.  CODE STATUS: Full code  TOTAL TIME TAKING CARE OF THIS PATIENT: 45 critical care minutes.    Hower,  Mardi Mainland.D on 04/09/2015 at 9:42 PM  Between 7am to 6pm - Pager - [phone]  After 6pm: House Pager: - [phone]  [facility]  Office  [phone]  CC: Primary care physician; Evelene Croon, MD

## 2015-04-09 NOTE — ED Notes (Signed)
Patient remained combative throughout ED coarse.  Dr. Huel Cote aware and frequently at bedside to evaluate patient.  Dr. Clint Guy also at bedside to evaluate patient.  Family at bedside.  Current condition and plan of care discussed with family.

## 2015-04-10 ENCOUNTER — Inpatient Hospital Stay: Payer: Medicaid Other

## 2015-04-10 DIAGNOSIS — F1210 Cannabis abuse, uncomplicated: Secondary | ICD-10-CM

## 2015-04-10 DIAGNOSIS — G9340 Encephalopathy, unspecified: Secondary | ICD-10-CM

## 2015-04-10 DIAGNOSIS — F1110 Opioid abuse, uncomplicated: Secondary | ICD-10-CM

## 2015-04-10 DIAGNOSIS — J9600 Acute respiratory failure, unspecified whether with hypoxia or hypercapnia: Secondary | ICD-10-CM

## 2015-04-10 DIAGNOSIS — T50901A Poisoning by unspecified drugs, medicaments and biological substances, accidental (unintentional), initial encounter: Secondary | ICD-10-CM

## 2015-04-10 LAB — URINALYSIS COMPLETE WITH MICROSCOPIC (ARMC ONLY)
BACTERIA UA: NONE SEEN
BILIRUBIN URINE: NEGATIVE
Glucose, UA: NEGATIVE mg/dL
Leukocytes, UA: NEGATIVE
Nitrite: NEGATIVE
PH: 5 (ref 5.0–8.0)
PROTEIN: NEGATIVE mg/dL
SQUAMOUS EPITHELIAL / LPF: NONE SEEN
Specific Gravity, Urine: 1.017 (ref 1.005–1.030)

## 2015-04-10 LAB — BASIC METABOLIC PANEL
ANION GAP: 7 (ref 5–15)
BUN: 13 mg/dL (ref 6–20)
CALCIUM: 8.9 mg/dL (ref 8.9–10.3)
CO2: 23 mmol/L (ref 22–32)
Chloride: 113 mmol/L — ABNORMAL HIGH (ref 101–111)
Creatinine, Ser: 0.81 mg/dL (ref 0.44–1.00)
Glucose, Bld: 87 mg/dL (ref 65–99)
POTASSIUM: 3 mmol/L — AB (ref 3.5–5.1)
Sodium: 143 mmol/L (ref 135–145)

## 2015-04-10 LAB — CREATININE, SERUM: Creatinine, Ser: 0.8 mg/dL (ref 0.44–1.00)

## 2015-04-10 LAB — CBC
HEMATOCRIT: 45 % (ref 35.0–47.0)
HEMATOCRIT: 42 % (ref 35.0–47.0)
HEMOGLOBIN: 15.3 g/dL (ref 12.0–16.0)
HEMOGLOBIN: 13.9 g/dL (ref 12.0–16.0)
MCH: 31.2 pg (ref 26.0–34.0)
MCH: 30.7 pg (ref 26.0–34.0)
MCHC: 33.9 g/dL (ref 32.0–36.0)
MCHC: 33.2 g/dL (ref 32.0–36.0)
MCV: 92 fL (ref 80.0–100.0)
MCV: 92.5 fL (ref 80.0–100.0)
Platelets: 236 10*3/uL (ref 150–440)
Platelets: 226 10*3/uL (ref 150–440)
RBC: 4.9 MIL/uL (ref 3.80–5.20)
RBC: 4.54 MIL/uL (ref 3.80–5.20)
RDW: 13.2 % (ref 11.5–14.5)
RDW: 13.3 % (ref 11.5–14.5)
WBC: 19.5 10*3/uL — AB (ref 3.6–11.0)
WBC: 16.5 10*3/uL — ABNORMAL HIGH (ref 3.6–11.0)

## 2015-04-10 LAB — GLUCOSE, CAPILLARY: GLUCOSE-CAPILLARY: 87 mg/dL (ref 65–99)

## 2015-04-10 LAB — BLOOD GAS, ARTERIAL
ACID-BASE DEFICIT: 4.2 mmol/L — AB (ref 0.0–2.0)
Allens test (pass/fail): POSITIVE — AB
Bicarbonate: 21 mEq/L (ref 21.0–28.0)
FIO2: 0.3
LHR: 14 {breaths}/min
Mechanical Rate: 14
O2 SAT: 98.7 %
PATIENT TEMPERATURE: 37
PCO2 ART: 38 mmHg (ref 32.0–48.0)
PEEP/CPAP: 5 cmH2O
PH ART: 7.35 (ref 7.350–7.450)
VT: 450 mL
pO2, Arterial: 127 mmHg — ABNORMAL HIGH (ref 83.0–108.0)

## 2015-04-10 LAB — MAGNESIUM: Magnesium: 1.9 mg/dL (ref 1.7–2.4)

## 2015-04-10 LAB — TRIGLYCERIDES: TRIGLYCERIDES: 144 mg/dL (ref <150)

## 2015-04-10 LAB — LACTIC ACID, PLASMA: Lactic Acid, Venous: 1 mmol/L (ref 0.5–2.0)

## 2015-04-10 LAB — MRSA PCR SCREENING: MRSA BY PCR: NEGATIVE

## 2015-04-10 LAB — TSH: TSH: 0.494 u[IU]/mL (ref 0.350–4.500)

## 2015-04-10 MED ORDER — FENTANYL 2500MCG IN NS 250ML (10MCG/ML) PREMIX INFUSION
25.0000 ug/h | INTRAVENOUS | Status: DC
  Administered 2015-04-10: 50 ug/h via INTRAVENOUS
  Filled 2015-04-10: qty 250

## 2015-04-10 MED ORDER — PANTOPRAZOLE SODIUM 40 MG IV SOLR
40.0000 mg | INTRAVENOUS | Status: DC
  Administered 2015-04-10: 40 mg via INTRAVENOUS
  Filled 2015-04-10: qty 40

## 2015-04-10 MED ORDER — IPRATROPIUM-ALBUTEROL 0.5-2.5 (3) MG/3ML IN SOLN
3.0000 mL | Freq: Four times a day (QID) | RESPIRATORY_TRACT | Status: DC
  Administered 2015-04-10 (×2): 3 mL via RESPIRATORY_TRACT
  Filled 2015-04-10 (×2): qty 3

## 2015-04-10 MED ORDER — LEVETIRACETAM 100 MG/ML PO SOLN
750.0000 mg | Freq: Two times a day (BID) | ORAL | Status: DC
  Administered 2015-04-10 – 2015-04-11 (×3): 750 mg via ORAL
  Filled 2015-04-10 (×5): qty 7.5

## 2015-04-10 MED ORDER — SODIUM CHLORIDE 0.9 % IV SOLN
1000.0000 mg | Freq: Once | INTRAVENOUS | Status: AC
  Administered 2015-04-10: 1000 mg via INTRAVENOUS
  Filled 2015-04-10: qty 10

## 2015-04-10 MED ORDER — GADOBENATE DIMEGLUMINE 529 MG/ML IV SOLN
20.0000 mL | Freq: Once | INTRAVENOUS | Status: AC | PRN
Start: 2015-04-10 — End: 2015-04-10
  Administered 2015-04-10: 16 mL via INTRAVENOUS

## 2015-04-10 MED ORDER — ENOXAPARIN SODIUM 40 MG/0.4ML ~~LOC~~ SOLN
40.0000 mg | SUBCUTANEOUS | Status: DC
  Administered 2015-04-10: 40 mg via SUBCUTANEOUS
  Filled 2015-04-10: qty 0.4

## 2015-04-10 MED ORDER — GABAPENTIN 300 MG PO CAPS
300.0000 mg | ORAL_CAPSULE | Freq: Two times a day (BID) | ORAL | Status: DC
  Administered 2015-04-10 – 2015-04-11 (×3): 300 mg via ORAL
  Filled 2015-04-10 (×3): qty 1

## 2015-04-10 MED ORDER — POTASSIUM CHLORIDE 10 MEQ/100ML IV SOLN
10.0000 meq | INTRAVENOUS | Status: AC
  Administered 2015-04-10 (×4): 10 meq via INTRAVENOUS
  Filled 2015-04-10 (×4): qty 100

## 2015-04-10 MED ORDER — POTASSIUM CHLORIDE 20 MEQ/15ML (10%) PO SOLN
60.0000 meq | Freq: Once | ORAL | Status: DC
  Filled 2015-04-10: qty 45

## 2015-04-10 MED ORDER — LEVETIRACETAM 750 MG PO TABS: 750.0000 mg | ORAL_TABLET | Freq: Two times a day (BID) | ORAL | Status: DC

## 2015-04-10 MED ORDER — FAMOTIDINE IN NACL 20-0.9 MG/50ML-% IV SOLN
20.0000 mg | Freq: Two times a day (BID) | INTRAVENOUS | Status: DC
  Administered 2015-04-10 (×2): 20 mg via INTRAVENOUS
  Filled 2015-04-10 (×4): qty 50

## 2015-04-10 MED ORDER — BUSPIRONE HCL 5 MG PO TABS
10.0000 mg | ORAL_TABLET | Freq: Two times a day (BID) | ORAL | Status: DC
  Administered 2015-04-10: 10 mg via ORAL
  Filled 2015-04-10: qty 2

## 2015-04-10 NOTE — Progress Notes (Signed)
Per Dr. Imogene Burn, ok to send pt out to the floor prior to MRI. Gloristine Turrubiates E 4:51 PM 04/10/2015

## 2015-04-10 NOTE — Progress Notes (Signed)
Initial Nutrition Assessment    INTERVENTION:   Meals and Snacks: Cater to patient preferences Medical Food Supplement Therapy: if po intake inadequate on follow-up, pt may benefit from addition of nutritional supplement  NUTRITION DIAGNOSIS:   Inadequate oral intake related to acute illness as evidenced by NPO status.   GOAL:   Patient will meet greater than or equal to 90% of their needs  MONITOR:    (Energy Intake, Anthropometrics, Electrolyte/Renal Profile, Glucose Profile, Digestive System)  REASON FOR ASSESSMENT:   Ventilator    ASSESSMENT:    Pt admitted with acute encephalopathy, multiple drugs in urine drug screen; possible seizure activity. Pt intubated on admission, s/p extubation this AM  Past Medical History  Diagnosis Date  . Asthma     Diet Order:  Diet regular Room service appropriate?: Yes; Fluid consistency:: Thin   Energy Intake: no intake as of yet, pt just extubated this AM and diet just advanced  Food and Nutrition Related History: unable to assess  Electrolyte and Renal Profile:  Recent Labs Lab 04/09/15 1624 04/10/15 0125 04/10/15 0604  BUN 12  --  13  CREATININE 0.80 0.80 0.81  NA 138  --  143  K 3.5  --  3.0*  MG  --   --  1.9   Glucose Profile:  Recent Labs  04/10/15 0051  GLUCAP 87   Protein Profile:  Recent Labs Lab 04/09/15 1624  ALBUMIN 4.6   Meds: reviewed  Skin:  Reviewed, no issues  Height:   Ht Readings from Last 1 Encounters:  04/10/15  (1.676 m)    Weight:   Wt Readings from Last 1 Encounters:  04/10/15 173 lb 1 oz (78.5 kg)    Filed Weights   04/09/15 1628 04/10/15 0015  Weight: 180 lb (81.647 kg) 173 lb 1 oz (78.5 kg)   Wt Readings from Last 10 Encounters:  04/10/15 173 lb 1 oz (78.5 kg)  12/03/14 180 lb (81.647 kg)    BMI:  Body mass index is 27.95 kg/(m^2).  LOW Care Level  Romelle Starcher MS, [place], LDN [phone] Pager

## 2015-04-10 NOTE — Care Management Note (Signed)
Case Management Note  Patient Details  Name: Elizabeth Shea MRN: [id number] Date of Birth: 1991/03/20  Subjective/Objective:  Admitted with acute encephalopathy. 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  Vented and sedated with propofol. Lives at home with her spouse. PCP is Dr. Glenis Smoker. HAs medicaid. No family at bedside.                  Action/Plan: RNCM following progression for discharge planning.   Expected Discharge Date:  04/13/15               Expected Discharge Plan:     In-House Referral:     Discharge planning Services  CM Consult  Post Acute Care Choice:    Choice offered to:     DME Arranged:    DME Agency:     HH Arranged:    HH Agency:     Status of Service:  In process, will continue to follow  Medicare Important Message Given:    Date Medicare IM Given:    Medicare IM give by:    Date Additional Medicare IM Given:    Additional Medicare Important Message give by:     If discussed at Long Length of Stay Meetings, dates discussed:    Additional Comments:  Elizabeth REAVES, RN 04/10/2015, 9:15 AM

## 2015-04-10 NOTE — Progress Notes (Signed)
eLink Physician-Brief Progress Note Patient Name: Elizabeth Shea DOB: [birth year] MRN: [id number]   Date of Service  04/10/2015  HPI/Events of Note  24 year old F with no significant PMH presenting with AMS/combative requiring intubation for severely agitated state.  UDS positive for opiates/methadone/cannibas/benzos. Also concern for potential seizure activity.   Currently HD stable on vent.  Sedated with propofol.  To be seen by neurology.  eICU Interventions  Plan of care per primary team. Best practice:  Ordered PCXR to follow up intubation and positioning of ETT PPI for stress ulcer propy while intubated On heparin subq for DVT propy Blood sugars have been stable.  Monitor as needed.     Intervention Category Evaluation Type: New Patient Evaluation  DETERDING,ELIZABETH 04/10/2015, 1:04 AM

## 2015-04-10 NOTE — Consult Note (Signed)
PULMONARY / CRITICAL CARE MEDICINE   Name: Elizabeth Shea MRN: [id number] DOB: Dec 17, 1990    ADMISSION DATE:  04/09/2015 CONSULTATION DATE:  9/14  INITIAL PRESENTATION:  24 F brought to ED by family for eval of depressed LOC. UDS positive for opiates, methadone, benzodiazepines and THC. Intubated for airway protection.   STUDIES:  9/13 CT head: NAD  SIGNIFICANT EVENTS: 9/13 intubated 9/14 extubated 9/14 Psych consultation requested   HISTORY OF PRESENT ILLNESS:  Pt unable to provide history. Admission note and hospital records reviewed in detail  PAST MEDICAL HISTORY :   has a past medical history of Asthma.  has past surgical history that includes Cholecystectomy. Prior to Admission medications   Medication Sig Start Date End Date Taking? Authorizing Provider  busPIRone (BUSPAR) 10 MG tablet Take 10 mg by mouth 2 (two) times daily.   Yes Historical Provider, MD  gabapentin (NEURONTIN) 300 MG capsule Take 300 mg by mouth 2 (two) times daily.   Yes Historical Provider, MD  oxyCODONE-acetaminophen (ROXICET) 5-325 MG per tablet Take 1 tablet by mouth every 6 (six) hours as needed. Patient taking differently: Take 1 tablet by mouth every 6 (six) hours as needed for severe pain.  12/04/14 12/04/15 Yes Rebecka Apley, MD   No Known Allergies  FAMILY HISTORY:  has no family status information on file.  SOCIAL HISTORY:  reports that she has been smoking.  She does not have any smokeless tobacco history on file. She reports that she does not drink alcohol or use illicit drugs.  REVIEW OF SYSTEMS: unable to obtain  SUBJECTIVE:   VITAL SIGNS: Temp:  [97.9 F (36.6 C)-99.9 F (37.7 C)] 98.9 F (37.2 C) (09/14 0400) Pulse Rate:  [87-142] 102 (09/14 1100) Resp:  [0-31] 20 (09/14 1100) BP: (98-130)/(56-94) 98/56 mmHg (09/14 1100) SpO2:  [92 %-100 %] 97 % (09/14 1100) FiO2 (%):  [24 %-100 %] 24 % (09/14 0946) Weight:  [78.5 kg (173 lb 1 oz)-81.647 kg (180 lb)] 78.5 kg (173 lb 1 oz)  (09/14 0015) HEMODYNAMICS:   VENTILATOR SETTINGS: Vent Mode:  [-] PRVC FiO2 (%):  [24 %-100 %] 24 % Set Rate:  [14 bmp] 14 bmp Vt Set:  [450 mL] 450 mL PEEP:  [5 cmH20] 5 cmH20 INTAKE / OUTPUT:  Intake/Output Summary (Last 24 hours) at 04/10/15 1229 Last data filed at 04/10/15 0647  Gross per 24 hour  Intake 462.37 ml  Output    725 ml  Net -262.63 ml    PHYSICAL EXAMINATION: General: WDWN, intubated, sedated Neuro: PERRL, CNs intact, MAEs, + F/C after reduction in sedatives HEENT: NCAT Cardiovascular:  Reg, no M Lungs: CTAP Abdomen: soft, NT, +BS Ext: warm, no edema  LABS:  CBC  Recent Labs Lab 04/09/15 1624 04/10/15 0125 04/10/15 0604  WBC 22.4* 19.5* 16.5*  HGB 15.5 15.3 13.9  HCT 47.2* 45.0 42.0  PLT 312 236 226   Coag's No results for input(s): APTT, INR in the last 168 hours. BMET  Recent Labs Lab 04/09/15 1624 04/10/15 0125 04/10/15 0604  NA 138  --  143  K 3.5  --  3.0*  CL 108  --  113*  CO2 14*  --  23  BUN 12  --  13  CREATININE 0.80 0.80 0.81  GLUCOSE 127*  --  87   Electrolytes  Recent Labs Lab 04/09/15 1624 04/10/15 0604  CALCIUM 9.5 8.9  MG  --  1.9   Sepsis Markers  Recent Labs Lab 04/09/15 1903  04/10/15 0127  LATICACIDVEN 1.2 1.0   ABG  Recent Labs Lab 04/09/15 1930 04/09/15 2231 04/10/15 0116  PHART 7.46* 7.35 7.35  PCO2ART 26* 37 38  PO2ART 117* 183* 127*   Liver Enzymes  Recent Labs Lab 04/09/15 1624  AST 47*  ALT 18  ALKPHOS 89  BILITOT 0.6  ALBUMIN 4.6   Cardiac Enzymes No results for input(s): TROPONINI, PROBNP in the last 168 hours. Glucose  Recent Labs Lab 04/10/15 0051  GLUCAP 87    CXR: NACPD    ASSESSMENT / PLAN: Polysubstance OD - appears to be unintentional VDRF due to AMS - has passed SBT this AM and able to F/C off sedation   Extubate  DC Foley Advance activity and diet as able Psych consultation requested Will probably be able to move out of ICU this  afternoon  Billy Fischer, MD PCCM service Mobile [phone] Pager [phone]   04/10/2015, 12:29 PM

## 2015-04-10 NOTE — Progress Notes (Signed)
Sedation on hold for EEG

## 2015-04-10 NOTE — Consult Note (Signed)
[place] [facility] Consult   Reason for Consult:  Consult for 24 year old woman who presented yesterday to the emergency room with an agitated delirium Referring Physician:  Bridgett Larsson Patient Identification: Elizabeth Shea MRN:  [id number] Principal Diagnosis: Acute encephalopathy Diagnosis:   Patient Active Problem List   Diagnosis Date Noted  . 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 Cannabis abuse [F12.10] 04/10/2015  . Acute encephalopathy [G93.40] 04/09/2015    Total Time spent with patient: 45 minutes  Subjective:   Elizabeth Shea is a 24 y.o. female patient admitted with patient was not able to give me any information herself.  HPI:  Information from the chart from review of the labs from attempts to speak with the patient and from a conversation with her husband. I also was present in the emergency room yesterday and overheard and saw some of the presentation at that time. 24 year old woman presented yesterday to the emergency room with agitation and confusion. History at that time was that she had been observed to have a seizure by her husband and was then brought to the emergency room. She required restraint and quite a bit of sedation and was ultimately intubated in the emergency room. Husband states that she was in her normal state of health until yesterday. He says that he thinks that she had a "fits" in the middle of the night and then again in the afternoon she had another episode that he describes as a seizure. He says that as far as he knows she has been compliant with her usual prescribed medication which again as far as he knows his only gabapentin. He does not identify any particular new stress that she has had or new medical problem. He says that the both of them had smoked some marijuana the night before but when I spoke to him today he minimizes the degree to which she participated. He does not believe that she abuses any other recreational drugs although on direct  questioning he admits that she will "take a pain pill" from time to time despite the fact that she is not prescribed to him.  Past psychiatric history: Patient was treated for some kind of behavior problem as a child and evidently was at one point diagnosed with ADHD or possibly with bipolar disorder. Husband knows that the patient was treated with Abilify and Strattera when she was an adolescent. Doesn't know anything else about her history. I don't have a history of psychiatric hospitalization available. It does appear that the patient was being prescribed Xanax up until the middle part of this summer but has not had any filled since July . No known history of suicide attempts.  Medical history: Patient has chronic pain in her knees which is blamed on some sort of complication of childbirth. Otherwise seems to not have a great deal of other chronic medical problems although she has visited the emergency room a remarkable number of times for someone her age.  Social history: Patient lives at home with her children. Does not work outside the home. She and her husband of been married about 6 years. Seemed to have a reasonably good relationship. He appears to be supportive.  Family history: Nonidentified  Substance abuse history: No identified history of alcohol abuse. Unclear whether she may have another overused or misused any sedatives or pain medicines in the past. No history of substance abuse treatment. HPI Elements:   Quality:  Confusion and ongoing delirium. Severity:  Moderate. Timing:  Acute starting yesterday  and seems to be episodic. Duration:  Intermittent. It appears that she was able to speak to Dr. Tamala Julian the neurologist earlier today but is now not speaking to me.. Context:  Possible history of multiple medication use.  Past Medical History:  Past Medical History  Diagnosis Date  . Asthma     Past Surgical History  Procedure Laterality Date  . Cholecystectomy     Family  History:  Family History  Problem Relation Age of Onset  . Seizures Mother    Social History:  History  Alcohol Use No     History  Drug Use No    Social History   Social History  . Marital Status: Married    Spouse Name: N/A  . Number of Children: N/A  . Years of Education: N/A   Social History Main Topics  . Smoking status: Current Every Day Smoker  . Smokeless tobacco: None  . Alcohol Use: No  . Drug Use: No  . Sexual Activity: Not Asked   Other Topics Concern  . None   Social History Narrative   Additional Social History:                          Allergies:  No Known Allergies  Labs:  Results for orders placed or performed during the hospital encounter of 04/09/15 (from the past 48 hour(s))  Comprehensive metabolic panel     Status: Abnormal   Collection Time: 04/09/15  4:24 PM  Result Value Ref Range   Sodium 138 135 - 145 mmol/L   Potassium 3.5 3.5 - 5.1 mmol/L   Chloride 108 101 - 111 mmol/L   CO2 14 (L) 22 - 32 mmol/L   Glucose, Bld 127 (H) 65 - 99 mg/dL   BUN 12 6 - 20 mg/dL   Creatinine, Ser 0.80 0.44 - 1.00 mg/dL   Calcium 9.5 8.9 - 10.3 mg/dL   Total Protein 8.1 6.5 - 8.1 g/dL   Albumin 4.6 3.5 - 5.0 g/dL   AST 47 (H) 15 - 41 U/L   ALT 18 14 - 54 U/L   Alkaline Phosphatase 89 38 - 126 U/L   Total Bilirubin 0.6 0.3 - 1.2 mg/dL   GFR calc non Af Amer >60 >60 mL/min   GFR calc Af Amer >60 >60 mL/min    Comment: (NOTE) The eGFR has been calculated using the CKD EPI equation. This calculation has not been validated in all clinical situations. eGFR's persistently <60 mL/min signify possible Chronic Kidney Disease.    Anion gap 16 (H) 5 - 15  CBC with Differential/Platelet     Status: Abnormal   Collection Time: 04/09/15  4:24 PM  Result Value Ref Range   WBC 22.4 (H) 3.6 - 11.0 K/uL   RBC 5.06 3.80 - 5.20 MIL/uL   Hemoglobin 15.5 12.0 - 16.0 g/dL   HCT 47.2 (H) 35.0 - 47.0 %   MCV 93.2 80.0 - 100.0 fL   MCH 30.7 26.0 - 34.0 pg    MCHC 32.9 32.0 - 36.0 g/dL   RDW 13.2 11.5 - 14.5 %   Platelets 312 150 - 440 K/uL   Neutrophils Relative % 82 %   Neutro Abs 18.4 (H) 1.4 - 6.5 K/uL   Lymphocytes Relative 14 %   Lymphs Abs 3.1 1.0 - 3.6 K/uL   Monocytes Relative 4 %   Monocytes Absolute 0.9 0.2 - 0.9 K/uL   Eosinophils Relative 0 %  Eosinophils Absolute 0.0 0 - 0.7 K/uL   Basophils Relative 0 %   Basophils Absolute 0.0 0 - 0.1 K/uL  Ethanol     Status: None   Collection Time: 04/09/15  4:24 PM  Result Value Ref Range   Alcohol, Ethyl (B) <5 <5 mg/dL    Comment:        LOWEST DETECTABLE LIMIT FOR SERUM ALCOHOL IS 5 mg/dL FOR MEDICAL PURPOSES ONLY   Pregnancy, urine     Status: None   Collection Time: 04/09/15  4:24 PM  Result Value Ref Range   Preg Test, Ur NEGATIVE NEGATIVE  Salicylate level     Status: None   Collection Time: 04/09/15  4:24 PM  Result Value Ref Range   Salicylate Lvl <6.5 2.8 - 30.0 mg/dL  Urine Drug Screen, Qualitative ([facility] only)     Status: Abnormal   Collection Time: 04/09/15  5:09 PM  Result Value Ref Range   Tricyclic, Ur Screen NONE DETECTED NONE DETECTED   Amphetamines, Ur Screen NONE DETECTED NONE DETECTED   MDMA (Ecstasy)Ur Screen NONE DETECTED NONE DETECTED   Cocaine Metabolite,Ur [place] NONE DETECTED NONE DETECTED   Opiate, Ur Screen POSITIVE (A) NONE DETECTED   Phencyclidine (PCP) Ur S NONE DETECTED NONE DETECTED   Cannabinoid 50 Ng, Ur [place] POSITIVE (A) NONE DETECTED   Barbiturates, Ur Screen NONE DETECTED NONE DETECTED   Benzodiazepine, Ur Scrn POSITIVE (A) NONE DETECTED   Methadone Scn, Ur POSITIVE (A) NONE DETECTED    Comment: (NOTE) 993  Tricyclics, urine               Cutoff 1000 ng/mL 200  Amphetamines, urine             Cutoff 1000 ng/mL 300  MDMA (Ecstasy), urine           Cutoff 500 ng/mL 400  Cocaine Metabolite, urine       Cutoff 300 ng/mL 500  Opiate, urine                   Cutoff 300 ng/mL 600  Phencyclidine (PCP), urine      Cutoff 25 ng/mL 700   Cannabinoid, urine              Cutoff 50 ng/mL 800  Barbiturates, urine             Cutoff 200 ng/mL 900  Benzodiazepine, urine           Cutoff 200 ng/mL 1000 Methadone, urine                Cutoff 300 ng/mL 1100 1200 The urine drug screen provides only a preliminary, unconfirmed 1300 analytical test result and should not be used for non-medical 1400 purposes. Clinical consideration and professional judgment should 1500 be applied to any positive drug screen result due to possible 1600 interfering substances. A more specific alternate chemical method 1700 must be used in order to obtain a confirmed analytical result.  1800 Gas chromato graphy / mass spectrometry (GC/MS) is the preferred 1900 confirmatory method.   Urinalysis complete, with microscopic ([facility] only)     Status: Abnormal   Collection Time: 04/09/15  5:09 PM  Result Value Ref Range   Color, Urine YELLOW (A) YELLOW   APPearance HAZY (A) CLEAR   Glucose, UA NEGATIVE NEGATIVE mg/dL   Bilirubin Urine NEGATIVE NEGATIVE   Ketones, ur 1+ (A) NEGATIVE mg/dL   Specific Gravity, Urine 1.017 1.005 - 1.030   Hgb urine  dipstick 1+ (A) NEGATIVE   pH 5.0 5.0 - 8.0   Protein, ur NEGATIVE NEGATIVE mg/dL   Nitrite NEGATIVE NEGATIVE   Leukocytes, UA NEGATIVE NEGATIVE   RBC / HPF 0-5 0 - 5 RBC/hpf   WBC, UA 0-5 0 - 5 WBC/hpf   Bacteria, UA NONE SEEN NONE SEEN   Squamous Epithelial / LPF NONE SEEN NONE SEEN   Mucous PRESENT    Uric Acid Crys, UA PRESENT   Lactic acid, plasma     Status: None   Collection Time: 04/09/15  7:03 PM  Result Value Ref Range   Lactic Acid, Venous 1.2 0.5 - 2.0 mmol/L  Blood gas, arterial ([facility] ONLY)     Status: Abnormal   Collection Time: 04/09/15  7:30 PM  Result Value Ref Range   FIO2 0.21    Delivery systems ROOM AIR    pH, Arterial 7.46 (H) 7.350 - 7.450   pCO2 arterial 26 (L) 32.0 - 48.0 mmHg   pO2, Arterial 117 (H) 83.0 - 108.0 mmHg   Bicarbonate 18.5 (L) 21.0 - 28.0 mEq/L   Acid-base  deficit 3.8 (H) 0.0 - 2.0 mmol/L   O2 Saturation 98.8 %   Patient temperature 37.0    Collection site RIGHT RADIAL    Sample type ARTERIAL DRAW    Allens test (pass/fail) POSITIVE (A) PASS  MRSA PCR Screening     Status: None   Collection Time: 04/09/15 10:30 PM  Result Value Ref Range   MRSA by PCR NEGATIVE NEGATIVE    Comment:        The GeneXpert MRSA Assay (FDA approved for NASAL specimens only), is one component of a comprehensive MRSA colonization surveillance program. It is not intended to diagnose MRSA infection nor to guide or monitor treatment for MRSA infections.   Blood gas, arterial     Status: Abnormal   Collection Time: 04/09/15 10:31 PM  Result Value Ref Range   FIO2 0.40    Delivery systems VENTILATOR    Mode ASSIST CONTROL    VT 450 mL   LHR 14 resp/min   Peep/cpap 5.0 cm H20   pH, Arterial 7.35 7.350 - 7.450   pCO2 arterial 37 32.0 - 48.0 mmHg   pO2, Arterial 183 (H) 83.0 - 108.0 mmHg   Bicarbonate 20.4 (L) 21.0 - 28.0 mEq/L   Acid-base deficit 4.7 (H) 0.0 - 2.0 mmol/L   O2 Saturation 99.6 %   Patient temperature 37.0    Collection site RIGHT RADIAL    Sample type ARTERIAL DRAW    Allens test (pass/fail) POSITIVE (A) PASS   Mechanical Rate 14   Glucose, capillary     Status: None   Collection Time: 04/10/15 12:51 AM  Result Value Ref Range   Glucose-Capillary 87 65 - 99 mg/dL  Blood gas, arterial     Status: Abnormal   Collection Time: 04/10/15  1:16 AM  Result Value Ref Range   FIO2 0.30    Delivery systems VENTILATOR    Mode PRESSURE REGULATED VOLUME CONTROL    VT 450 mL   LHR 14 resp/min   Peep/cpap 5.0 cm H20   pH, Arterial 7.35 7.350 - 7.450   pCO2 arterial 38 32.0 - 48.0 mmHg   pO2, Arterial 127 (H) 83.0 - 108.0 mmHg   Bicarbonate 21.0 21.0 - 28.0 mEq/L   Acid-base deficit 4.2 (H) 0.0 - 2.0 mmol/L   O2 Saturation 98.7 %   Patient temperature 37.0    Collection  site RIGHT RADIAL    Sample type ARTERIAL DRAW    Allens test  (pass/fail) POSITIVE (A) PASS   Mechanical Rate 14   Triglycerides     Status: None   Collection Time: 04/10/15  1:25 AM  Result Value Ref Range   Triglycerides 144 <150 mg/dL  CBC     Status: Abnormal   Collection Time: 04/10/15  1:25 AM  Result Value Ref Range   WBC 19.5 (H) 3.6 - 11.0 K/uL   RBC 4.90 3.80 - 5.20 MIL/uL   Hemoglobin 15.3 12.0 - 16.0 g/dL   HCT 45.0 35.0 - 47.0 %   MCV 92.0 80.0 - 100.0 fL   MCH 31.2 26.0 - 34.0 pg   MCHC 33.9 32.0 - 36.0 g/dL   RDW 13.2 11.5 - 14.5 %   Platelets 236 150 - 440 K/uL  Creatinine, serum     Status: None   Collection Time: 04/10/15  1:25 AM  Result Value Ref Range   Creatinine, Ser 0.80 0.44 - 1.00 mg/dL   GFR calc non Af Amer >60 >60 mL/min   GFR calc Af Amer >60 >60 mL/min    Comment: (NOTE) The eGFR has been calculated using the CKD EPI equation. This calculation has not been validated in all clinical situations. eGFR's persistently <60 mL/min signify possible Chronic Kidney Disease.   TSH     Status: None   Collection Time: 04/10/15  1:25 AM  Result Value Ref Range   TSH 0.494 0.350 - 4.500 uIU/mL  Lactic acid, plasma     Status: None   Collection Time: 04/10/15  1:27 AM  Result Value Ref Range   Lactic Acid, Venous 1.0 0.5 - 2.0 mmol/L  Basic metabolic panel     Status: Abnormal   Collection Time: 04/10/15  6:04 AM  Result Value Ref Range   Sodium 143 135 - 145 mmol/L   Potassium 3.0 (L) 3.5 - 5.1 mmol/L   Chloride 113 (H) 101 - 111 mmol/L   CO2 23 22 - 32 mmol/L   Glucose, Bld 87 65 - 99 mg/dL   BUN 13 6 - 20 mg/dL   Creatinine, Ser 0.81 0.44 - 1.00 mg/dL   Calcium 8.9 8.9 - 10.3 mg/dL   GFR calc non Af Amer >60 >60 mL/min   GFR calc Af Amer >60 >60 mL/min    Comment: (NOTE) The eGFR has been calculated using the CKD EPI equation. This calculation has not been validated in all clinical situations. eGFR's persistently <60 mL/min signify possible Chronic Kidney Disease.    Anion gap 7 5 - 15  CBC     Status:  Abnormal   Collection Time: 04/10/15  6:04 AM  Result Value Ref Range   WBC 16.5 (H) 3.6 - 11.0 K/uL   RBC 4.54 3.80 - 5.20 MIL/uL   Hemoglobin 13.9 12.0 - 16.0 g/dL   HCT 42.0 35.0 - 47.0 %   MCV 92.5 80.0 - 100.0 fL   MCH 30.7 26.0 - 34.0 pg   MCHC 33.2 32.0 - 36.0 g/dL   RDW 13.3 11.5 - 14.5 %   Platelets 226 150 - 440 K/uL  Magnesium     Status: None   Collection Time: 04/10/15  6:04 AM  Result Value Ref Range   Magnesium 1.9 1.7 - 2.4 mg/dL    Vitals: Blood pressure 112/71, pulse 93, temperature 98.5 F (36.9 C), temperature source Oral, resp. rate 0, height 5' 6" (1.676 m), weight 78.5 kg (173  lb 1 oz), SpO2 98 %.  Risk to Self:   Risk to Others:   Prior Inpatient Therapy:   Prior Outpatient Therapy:    Current Facility-Administered Medications  Medication Dose Route Frequency Provider Last Rate Last Dose  . acetaminophen (TYLENOL) tablet 650 mg  650 mg Oral Q6H PRN Lytle Butte, MD      . enoxaparin (LOVENOX) injection 40 mg  40 mg Subcutaneous Q24H Wilhelmina Mcardle, MD      . gabapentin (NEURONTIN) capsule 300 mg  300 mg Oral BID Lytle Butte, MD   300 mg at 04/10/15 1308  . levETIRAcetam (KEPPRA) 100 MG/ML solution 750 mg  750 mg Oral BID Valora Corporal, MD   750 mg at 04/10/15 1440  . ondansetron (ZOFRAN) injection 4 mg  4 mg Intravenous Q6H PRN Lytle Butte, MD      . oxyCODONE (Oxy IR/ROXICODONE) immediate release tablet 5 mg  5 mg Oral Q4H PRN Lytle Butte, MD      . sodium chloride 0.9 % injection 3 mL  3 mL Intravenous Q12H Lytle Butte, MD   3 mL at 04/10/15 0112    Musculoskeletal: Strength & Muscle Tone: decreased Gait & Station: unsteady Patient leans: N/A  Psychiatric Specialty Exam: Physical Exam  Nursing note and vitals reviewed. Constitutional: She appears well-developed and well-nourished.  HENT:  Head: Normocephalic and atraumatic.  Eyes: Conjunctivae are normal. Pupils are equal, round, and reactive to light.  Neck: Normal range of motion.   Cardiovascular: Normal heart sounds.   Respiratory: Effort normal.  GI: Soft.  Musculoskeletal: Normal range of motion.  Neurological: She is alert.  Skin: Skin is warm and dry.  Psychiatric: Her affect is blunt. She is withdrawn. She is noncommunicative. She exhibits abnormal recent memory and abnormal remote memory.  Patient is currently sound asleep and I was not able to wake her. Spoke her name multiple times loudly and gently touched her on the shoulder. She opened her eyes on 2 occasions but did not vocalize. Would not engage in any conversation.    Review of Systems  Unable to perform ROS   Blood pressure 112/71, pulse 93, temperature 98.5 F (36.9 C), temperature source Oral, resp. rate 0, height 5' 6" (1.676 m), weight 78.5 kg (173 lb 1 oz), SpO2 98 %.Body mass index is 27.95 kg/(m^2).  General Appearance: Casual  Eye Contact::  None  Speech:  Nonexistent  Volume:  Decreased  Mood:  Not stated  Affect:  Flat  Thought Process:  Negative  Orientation:  Negative  Thought Content:  Negative  Suicidal Thoughts:  Not stated  Homicidal Thoughts:  Not stated  Memory:  Negative  Judgement:  Negative  Insight:  Negative  Psychomotor Activity:  Negative  Concentration:  Negative  Recall:  Negative  Fund of Knowledge:Negative  Language: Negative  Akathisia:  Negative  Handed:  Right  AIMS (if indicated):     Assets:  Social Support  ADL's:  Impaired  Cognition: Impaired,  Severe  Sleep:      Medical Decision Making: New problem, with additional work up planned, Review of Psycho-Social Stressors (1), Review or order clinical lab tests (1) and Review of Medication Regimen & Side Effects (2)  Treatment Plan Summary: Plan Currently the patient is not giving me any history. See that Dr. 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 Some features that I would point out that still seem hard to explain are  her ongoing sedation this evening and also  the extended period of agitated behavior in the emergency room yesterday. I'm also concerned about her drug screen which is positive for cannabis and opiates and methadone as well as benzodiazepine's. The benzodiazepine's could possibly be medicine she was given in the emergency room I'm not sure about that but the others are not. She clearly is abusing marijuana and opiates that she is not prescribed. Not clear how that might play into the current presentation. Certainly she could have new-onset epilepsy separate from the fact that she abuses marijuana and pain medication. I will try to check up with her tomorrow with possible before she is discharged. I don't see any indication for specific psychiatric treatment at this time.  Plan:  Patient does not meet criteria for psychiatric inpatient admission. Disposition: Follow-up as needed. No prescriptions written.  Aireana Ryland 04/10/2015 5:50 PM

## 2015-04-10 NOTE — Progress Notes (Addendum)
[facility] - [place] at [facility]   PATIENT NAME: Elizabeth Shea    MR#:  [id number]  DATE OF BIRTH:  Apr 10, 1991  SUBJECTIVE:  CHIEF COMPLAINT:   Chief Complaint  Patient presents with  . Altered Mental Status   On vent and sedation. S/p EEG just now. She was agitated while off sedation for EEG per RN. REVIEW OF SYSTEMS:    Unable to obtain. DRUG ALLERGIES:  No Known Allergies  VITALS:  Blood pressure 127/86, pulse 87, temperature 98.9 F (37.2 C), temperature source Oral, resp. rate 19, height  (1.676 m), weight 78.5 kg (173 lb 1 oz), SpO2 100 %.  PHYSICAL EXAMINATION:  GENERAL:  24 y.o.-year-old patient lying in the bed with ventilation.  EYES: Pupils equal, round, pinpoint. No scleral icterus.  HEENT: Head atraumatic, normocephalic. NECK:  Supple, no jugular venous distention. No thyroid enlargement, no tenderness.  LUNGS: Normal breath sounds bilaterally, no wheezing, rales,rhonchi or crepitation. No use of accessory muscles of respiration.  CARDIOVASCULAR: S1, S2 normal. No murmurs, rubs, or gallops.  ABDOMEN: Soft, nontender, nondistended. Bowel sounds present. No organomegaly or mass.  EXTREMITIES: No pedal edema, cyanosis, or clubbing.  NEUROLOGIC: unable to exam. PSYCHIATRIC: on vent. SKIN: No obvious rash, lesion, or ulcer.    LABORATORY PANEL:   CBC  Recent Labs Lab 04/10/15 0604  WBC 16.5*  HGB 13.9  HCT 42.0  PLT 226   ------------------------------------------------------------------------------------------------------------------  Chemistries   Recent Labs Lab 04/09/15 1624  04/10/15 0604  NA 138  --  143  K 3.5  --  3.0*  CL 108  --  113*  CO2 14*  --  23  GLUCOSE 127*  --  87  BUN 12  --  13  CREATININE 0.80  < > 0.81  CALCIUM 9.5  --  8.9  AST 47*  --   --   ALT 18  --   --   ALKPHOS 89  --   --   BILITOT 0.6  --   --   < > = values in this interval not  displayed. ------------------------------------------------------------------------------------------------------------------  Cardiac Enzymes No results for input(s): TROPONINI in the last 168 hours. ------------------------------------------------------------------------------------------------------------------  RADIOLOGY:  Ct Head Wo Contrast  04/09/2015   CLINICAL DATA:  Altered mental status.  EXAM: CT HEAD WITHOUT CONTRAST  TECHNIQUE: Contiguous axial images were obtained from the base of the skull through the vertex without intravenous contrast.  COMPARISON:  12/04/2014  FINDINGS: Sinuses/Soft tissues: Moderate to marked motion degradation throughout, despite multiple attempts. Partial ethmoid air cell opacification.  Intracranial: No gross acute intracranial abnormality. No mass lesion, hemorrhage, hydrocephalus, acute infarct, intra-axial, or extra-axial fluid collection.  IMPRESSION: 1. Moderate to marked motion degradation, despite multiple attempts. 2. No gross acute intracranial abnormality. 3. Consider repeat when patient is able to hold still. 4. Sinus disease.   Electronically Signed   By: Jeronimo Greaves M.D.   On: 04/09/2015 22:00   Dg Chest Port 1 View  04/10/2015   CLINICAL DATA:  Post intubation.  EXAM: PORTABLE CHEST - 1 VIEW  COMPARISON:  Chest radiograph obtained yesterday.  FINDINGS: The endotracheal tube is 2.5 cm from the carina. The cardiomediastinal contours are normal. The lungs are clear. Pulmonary vasculature is normal. No consolidation, pleural effusion, or pneumothorax. No acute osseous abnormalities are seen.  IMPRESSION: Endotracheal tube 2.5 cm from the carina.  The lungs are clear.   Electronically Signed   By: Rubye Oaks M.D.   On:  04/10/2015 02:03   Dg Chest Portable 1 View  04/09/2015   CLINICAL DATA:  Unresponsive.  Altered mental status.  EXAM: PORTABLE CHEST - 1 VIEW  COMPARISON:  12/04/2014  FINDINGS: Minimal motion degradation. Artifact degradation  projects over the lung bases. Midline trachea. Normal heart size. No gross pleural fluid. No pneumothorax. Upper lungs are clear.  IMPRESSION: No acute cardiopulmonary disease.  Motion and artifact degradation over the lower chest.   Electronically Signed   By: Jeronimo Greaves M.D.   On: 04/09/2015 18:45    EKG:   Orders placed or performed in visit on 07/26/14  . EKG 12-Lead    ASSESSMENT AND PLAN:   1. Acute encephalopathy: possible due to substance abuse/seizure, f/u EEG and neuro consult.  2..  Acute Respiratory failure:  Try to wean off mechanical ventilation.  3. Hypokalemia: KCl supplement, f/u BMP and Mag.    All the records are reviewed and case discussed with Care Management/Social Workerr. Management plans discussed with the patient's parents and they are in agreement. Greater than 50% of time spent in coordination of care and discussing plans with Dr. Sharol Harness.  CODE STATUS: Full code  TOTAL CRITICAL TIME TAKING CARE OF THIS PATIENT: 42 minutes.   POSSIBLE D/C IN 2-3 DAYS, DEPENDING ON CLINICAL CONDITION.   Shaune Pollack M.D on 04/10/2015 at 8:04 AM  Between 7am to 6pm - Pager - [phone]  After 6pm go to [email or web address] - password [email or web address] [facility]  [facility]  Office  [phone]  CC: Primary care physician; Evelene Croon, MD

## 2015-04-10 NOTE — Consult Note (Signed)
Reason for Consult: altered mental status Referring Physician: Dr. [name] is an 24 y.o. female.  HPI: seen at request of Dr. Bridgett Shea for altered mental status;  24 yo RHD F presents to [facility] after being asleep with husband and then having seizure activity followed by agitation.  Husband notes that she had similar episode in 2013 and was not on any medication at that time too.  Pt does not remember either episode but she was noted to have lateral tongue biting with both episodes.  Husband also notes that she gave an yell before everything started too.  Pt denies headache or feeling anything like deja vu prior to episode.  Past Medical History  Diagnosis Date  . Asthma     Past Surgical History  Procedure Laterality Date  . Cholecystectomy      Family History  Problem Relation Age of Onset  . Seizures Mother     Social History:  reports that she has been smoking.  She does not have any smokeless tobacco history on file. She reports that she does not drink alcohol or use illicit drugs.  Allergies: No Known Allergies  Medications: personally reviewed by me  Results for orders placed or performed during the hospital encounter of 04/09/15 (from the past 48 hour(s))  Comprehensive metabolic panel     Status: Abnormal   Collection Time: 04/09/15  4:24 PM  Result Value Ref Range   Sodium 138 135 - 145 mmol/L   Potassium 3.5 3.5 - 5.1 mmol/L   Chloride 108 101 - 111 mmol/L   CO2 14 (L) 22 - 32 mmol/L   Glucose, Bld 127 (H) 65 - 99 mg/dL   BUN 12 6 - 20 mg/dL   Creatinine, Ser 0.80 0.44 - 1.00 mg/dL   Calcium 9.5 8.9 - 10.3 mg/dL   Total Protein 8.1 6.5 - 8.1 g/dL   Albumin 4.6 3.5 - 5.0 g/dL   AST 47 (H) 15 - 41 U/L   ALT 18 14 - 54 U/L   Alkaline Phosphatase 89 38 - 126 U/L   Total Bilirubin 0.6 0.3 - 1.2 mg/dL   GFR calc non Af Amer >60 >60 mL/min   GFR calc Af Amer >60 >60 mL/min    Comment: (NOTE) The eGFR has been calculated using the CKD EPI equation. This  calculation has not been validated in all clinical situations. eGFR's persistently <60 mL/min signify possible Chronic Kidney Disease.    Anion gap 16 (H) 5 - 15  CBC with Differential/Platelet     Status: Abnormal   Collection Time: 04/09/15  4:24 PM  Result Value Ref Range   WBC 22.4 (H) 3.6 - 11.0 K/uL   RBC 5.06 3.80 - 5.20 MIL/uL   Hemoglobin 15.5 12.0 - 16.0 g/dL   HCT 47.2 (H) 35.0 - 47.0 %   MCV 93.2 80.0 - 100.0 fL   MCH 30.7 26.0 - 34.0 pg   MCHC 32.9 32.0 - 36.0 g/dL   RDW 13.2 11.5 - 14.5 %   Platelets 312 150 - 440 K/uL   Neutrophils Relative % 82 %   Neutro Abs 18.4 (H) 1.4 - 6.5 K/uL   Lymphocytes Relative 14 %   Lymphs Abs 3.1 1.0 - 3.6 K/uL   Monocytes Relative 4 %   Monocytes Absolute 0.9 0.2 - 0.9 K/uL   Eosinophils Relative 0 %   Eosinophils Absolute 0.0 0 - 0.7 K/uL   Basophils Relative 0 %   Basophils Absolute 0.0  0 - 0.1 K/uL  Ethanol     Status: None   Collection Time: 04/09/15  4:24 PM  Result Value Ref Range   Alcohol, Ethyl (B) <5 <5 mg/dL    Comment:        LOWEST DETECTABLE LIMIT FOR SERUM ALCOHOL IS 5 mg/dL FOR MEDICAL PURPOSES ONLY   Pregnancy, urine     Status: None   Collection Time: 04/09/15  4:24 PM  Result Value Ref Range   Preg Test, Ur NEGATIVE NEGATIVE  Salicylate level     Status: None   Collection Time: 04/09/15  4:24 PM  Result Value Ref Range   Salicylate Lvl <1.7 2.8 - 30.0 mg/dL  Urine Drug Screen, Qualitative ([facility] only)     Status: Abnormal   Collection Time: 04/09/15  5:09 PM  Result Value Ref Range   Tricyclic, Ur Screen NONE DETECTED NONE DETECTED   Amphetamines, Ur Screen NONE DETECTED NONE DETECTED   MDMA (Ecstasy)Ur Screen NONE DETECTED NONE DETECTED   Cocaine Metabolite,Ur [place] NONE DETECTED NONE DETECTED   Opiate, Ur Screen POSITIVE (A) NONE DETECTED   Phencyclidine (PCP) Ur S NONE DETECTED NONE DETECTED   Cannabinoid 50 Ng, Ur [place] POSITIVE (A) NONE DETECTED   Barbiturates, Ur Screen NONE DETECTED NONE DETECTED     Benzodiazepine, Ur Scrn POSITIVE (A) NONE DETECTED   Methadone Scn, Ur POSITIVE (A) NONE DETECTED    Comment: (NOTE) 510  Tricyclics, urine               Cutoff 1000 ng/mL 200  Amphetamines, urine             Cutoff 1000 ng/mL 300  MDMA (Ecstasy), urine           Cutoff 500 ng/mL 400  Cocaine Metabolite, urine       Cutoff 300 ng/mL 500  Opiate, urine                   Cutoff 300 ng/mL 600  Phencyclidine (PCP), urine      Cutoff 25 ng/mL 700  Cannabinoid, urine              Cutoff 50 ng/mL 800  Barbiturates, urine             Cutoff 200 ng/mL 900  Benzodiazepine, urine           Cutoff 200 ng/mL 1000 Methadone, urine                Cutoff 300 ng/mL 1100 1200 The urine drug screen provides only a preliminary, unconfirmed 1300 analytical test result and should not be used for non-medical 1400 purposes. Clinical consideration and professional judgment should 1500 be applied to any positive drug screen result due to possible 1600 interfering substances. A more specific alternate chemical method 1700 must be used in order to obtain a confirmed analytical result.  1800 Gas chromato graphy / mass spectrometry (GC/MS) is the preferred 1900 confirmatory method.   Urinalysis complete, with microscopic ([facility] only)     Status: Abnormal   Collection Time: 04/09/15  5:09 PM  Result Value Ref Range   Color, Urine YELLOW (A) YELLOW   APPearance HAZY (A) CLEAR   Glucose, UA NEGATIVE NEGATIVE mg/dL   Bilirubin Urine NEGATIVE NEGATIVE   Ketones, ur 1+ (A) NEGATIVE mg/dL   Specific Gravity, Urine 1.017 1.005 - 1.030   Hgb urine dipstick 1+ (A) NEGATIVE   pH 5.0 5.0 - 8.0   Protein, ur NEGATIVE NEGATIVE  mg/dL   Nitrite NEGATIVE NEGATIVE   Leukocytes, UA NEGATIVE NEGATIVE   RBC / HPF 0-5 0 - 5 RBC/hpf   WBC, UA 0-5 0 - 5 WBC/hpf   Bacteria, UA NONE SEEN NONE SEEN   Squamous Epithelial / LPF NONE SEEN NONE SEEN   Mucous PRESENT    Uric Acid Crys, UA PRESENT   Lactic acid, plasma     Status:  None   Collection Time: 04/09/15  7:03 PM  Result Value Ref Range   Lactic Acid, Venous 1.2 0.5 - 2.0 mmol/L  Blood gas, arterial ([facility] ONLY)     Status: Abnormal   Collection Time: 04/09/15  7:30 PM  Result Value Ref Range   FIO2 0.21    Delivery systems ROOM AIR    pH, Arterial 7.46 (H) 7.350 - 7.450   pCO2 arterial 26 (L) 32.0 - 48.0 mmHg   pO2, Arterial 117 (H) 83.0 - 108.0 mmHg   Bicarbonate 18.5 (L) 21.0 - 28.0 mEq/L   Acid-base deficit 3.8 (H) 0.0 - 2.0 mmol/L   O2 Saturation 98.8 %   Patient temperature 37.0    Collection site RIGHT RADIAL    Sample type ARTERIAL DRAW    Allens test (pass/fail) POSITIVE (A) PASS  MRSA PCR Screening     Status: None   Collection Time: 04/09/15 10:30 PM  Result Value Ref Range   MRSA by PCR NEGATIVE NEGATIVE    Comment:        The GeneXpert MRSA Assay (FDA approved for NASAL specimens only), is one component of a comprehensive MRSA colonization surveillance program. It is not intended to diagnose MRSA infection nor to guide or monitor treatment for MRSA infections.   Blood gas, arterial     Status: Abnormal   Collection Time: 04/09/15 10:31 PM  Result Value Ref Range   FIO2 0.40    Delivery systems VENTILATOR    Mode ASSIST CONTROL    VT 450 mL   LHR 14 resp/min   Peep/cpap 5.0 cm H20   pH, Arterial 7.35 7.350 - 7.450   pCO2 arterial 37 32.0 - 48.0 mmHg   pO2, Arterial 183 (H) 83.0 - 108.0 mmHg   Bicarbonate 20.4 (L) 21.0 - 28.0 mEq/L   Acid-base deficit 4.7 (H) 0.0 - 2.0 mmol/L   O2 Saturation 99.6 %   Patient temperature 37.0    Collection site RIGHT RADIAL    Sample type ARTERIAL DRAW    Allens test (pass/fail) POSITIVE (A) PASS   Mechanical Rate 14   Glucose, capillary     Status: None   Collection Time: 04/10/15 12:51 AM  Result Value Ref Range   Glucose-Capillary 87 65 - 99 mg/dL  Blood gas, arterial     Status: Abnormal   Collection Time: 04/10/15  1:16 AM  Result Value Ref Range   FIO2 0.30    Delivery  systems VENTILATOR    Mode PRESSURE REGULATED VOLUME CONTROL    VT 450 mL   LHR 14 resp/min   Peep/cpap 5.0 cm H20   pH, Arterial 7.35 7.350 - 7.450   pCO2 arterial 38 32.0 - 48.0 mmHg   pO2, Arterial 127 (H) 83.0 - 108.0 mmHg   Bicarbonate 21.0 21.0 - 28.0 mEq/L   Acid-base deficit 4.2 (H) 0.0 - 2.0 mmol/L   O2 Saturation 98.7 %   Patient temperature 37.0    Collection site RIGHT RADIAL    Sample type ARTERIAL DRAW    Allens test (pass/fail)  POSITIVE (A) PASS   Mechanical Rate 14   Triglycerides     Status: None   Collection Time: 04/10/15  1:25 AM  Result Value Ref Range   Triglycerides 144 <150 mg/dL  CBC     Status: Abnormal   Collection Time: 04/10/15  1:25 AM  Result Value Ref Range   WBC 19.5 (H) 3.6 - 11.0 K/uL   RBC 4.90 3.80 - 5.20 MIL/uL   Hemoglobin 15.3 12.0 - 16.0 g/dL   HCT 45.0 35.0 - 47.0 %   MCV 92.0 80.0 - 100.0 fL   MCH 31.2 26.0 - 34.0 pg   MCHC 33.9 32.0 - 36.0 g/dL   RDW 13.2 11.5 - 14.5 %   Platelets 236 150 - 440 K/uL  Creatinine, serum     Status: None   Collection Time: 04/10/15  1:25 AM  Result Value Ref Range   Creatinine, Ser 0.80 0.44 - 1.00 mg/dL   GFR calc non Af Amer >60 >60 mL/min   GFR calc Af Amer >60 >60 mL/min    Comment: (NOTE) The eGFR has been calculated using the CKD EPI equation. This calculation has not been validated in all clinical situations. eGFR's persistently <60 mL/min signify possible Chronic Kidney Disease.   TSH     Status: None   Collection Time: 04/10/15  1:25 AM  Result Value Ref Range   TSH 0.494 0.350 - 4.500 uIU/mL  Lactic acid, plasma     Status: None   Collection Time: 04/10/15  1:27 AM  Result Value Ref Range   Lactic Acid, Venous 1.0 0.5 - 2.0 mmol/L  Basic metabolic panel     Status: Abnormal   Collection Time: 04/10/15  6:04 AM  Result Value Ref Range   Sodium 143 135 - 145 mmol/L   Potassium 3.0 (L) 3.5 - 5.1 mmol/L   Chloride 113 (H) 101 - 111 mmol/L   CO2 23 22 - 32 mmol/L   Glucose, Bld  87 65 - 99 mg/dL   BUN 13 6 - 20 mg/dL   Creatinine, Ser 0.81 0.44 - 1.00 mg/dL   Calcium 8.9 8.9 - 10.3 mg/dL   GFR calc non Af Amer >60 >60 mL/min   GFR calc Af Amer >60 >60 mL/min    Comment: (NOTE) The eGFR has been calculated using the CKD EPI equation. This calculation has not been validated in all clinical situations. eGFR's persistently <60 mL/min signify possible Chronic Kidney Disease.    Anion gap 7 5 - 15  CBC     Status: Abnormal   Collection Time: 04/10/15  6:04 AM  Result Value Ref Range   WBC 16.5 (H) 3.6 - 11.0 K/uL   RBC 4.54 3.80 - 5.20 MIL/uL   Hemoglobin 13.9 12.0 - 16.0 g/dL   HCT 42.0 35.0 - 47.0 %   MCV 92.5 80.0 - 100.0 fL   MCH 30.7 26.0 - 34.0 pg   MCHC 33.2 32.0 - 36.0 g/dL   RDW 13.3 11.5 - 14.5 %   Platelets 226 150 - 440 K/uL  Magnesium     Status: None   Collection Time: 04/10/15  6:04 AM  Result Value Ref Range   Magnesium 1.9 1.7 - 2.4 mg/dL    Ct Head Wo Contrast  04/09/2015   CLINICAL DATA:  Altered mental status.  EXAM: CT HEAD WITHOUT CONTRAST  TECHNIQUE: Contiguous axial images were obtained from the base of the skull through the vertex without intravenous contrast.  COMPARISON:  12/04/2014  FINDINGS: Sinuses/Soft tissues: Moderate to marked motion degradation throughout, despite multiple attempts. Partial ethmoid air cell opacification.  Intracranial: No gross acute intracranial abnormality. No mass lesion, hemorrhage, hydrocephalus, acute infarct, intra-axial, or extra-axial fluid collection.  IMPRESSION: 1. Moderate to marked motion degradation, despite multiple attempts. 2. No gross acute intracranial abnormality. 3. Consider repeat when patient is able to hold still. 4. Sinus disease.   Electronically Signed   By: Abigail Miyamoto M.D.   On: 04/09/2015 22:00   Dg Chest Port 1 View  04/10/2015   CLINICAL DATA:  Post intubation.  EXAM: PORTABLE CHEST - 1 VIEW  COMPARISON:  Chest radiograph obtained yesterday.  FINDINGS: The endotracheal tube is  2.5 cm from the carina. The cardiomediastinal contours are normal. The lungs are clear. Pulmonary vasculature is normal. No consolidation, pleural effusion, or pneumothorax. No acute osseous abnormalities are seen.  IMPRESSION: Endotracheal tube 2.5 cm from the carina.  The lungs are clear.   Electronically Signed   By: Jeb Levering M.D.   On: 04/10/2015 02:03   Dg Chest Portable 1 View  04/09/2015   CLINICAL DATA:  Unresponsive.  Altered mental status.  EXAM: PORTABLE CHEST - 1 VIEW  COMPARISON:  12/04/2014  FINDINGS: Minimal motion degradation. Artifact degradation projects over the lung bases. Midline trachea. Normal heart size. No gross pleural fluid. No pneumothorax. Upper lungs are clear.  IMPRESSION: No acute cardiopulmonary disease.  Motion and artifact degradation over the lower chest.   Electronically Signed   By: Abigail Miyamoto M.D.   On: 04/09/2015 18:45    Review of Systems  Constitutional: Negative.   HENT: Positive for sore throat. Negative for congestion, ear discharge, ear pain, hearing loss, nosebleeds and tinnitus.   Eyes: Negative.   Respiratory: Negative.  Negative for stridor.   Cardiovascular: Negative.   Gastrointestinal: Negative.   Genitourinary: Negative.   Musculoskeletal: Negative.   Skin: Negative.   Neurological: Positive for seizures and loss of consciousness. Negative for dizziness, tingling, tremors, sensory change, speech change, focal weakness and headaches.  Psychiatric/Behavioral: Positive for substance abuse. Negative for depression, suicidal ideas, hallucinations and memory loss. The patient is not nervous/anxious and does not have insomnia.    Blood pressure 98/56, pulse 102, temperature 98.9 F (37.2 C), temperature source Oral, resp. rate 20, height '5\' 6"'  (1.676 m), weight 78.5 kg (173 lb 1 oz), SpO2 97 %. Physical Exam  Nursing note and vitals reviewed. Constitutional: She is oriented to person, place, and time. She appears well-developed and  well-nourished. She appears distressed.  HENT:  Head: Normocephalic and atraumatic.  Right Ear: External ear normal.  Left Ear: External ear normal.  Nose: Nose normal.  Mouth/Throat: Oropharynx is clear and moist.    Eyes: Conjunctivae and EOM are normal. Pupils are equal, round, and reactive to light. No scleral icterus.  Neck: Normal range of motion. Neck supple. No thyromegaly present.  Cardiovascular: Normal rate, regular rhythm, normal heart sounds and intact distal pulses.   No murmur heard. Respiratory: Effort normal and breath sounds normal. No respiratory distress. She has no wheezes.  GI: Soft. Bowel sounds are normal. She exhibits no distension. There is no tenderness.  Musculoskeletal: Normal range of motion. She exhibits no edema.  Neurological: She is alert and oriented to person, place, and time. She has normal reflexes. She displays normal reflexes. No cranial nerve deficit. She exhibits normal muscle tone. Coordination normal.  Skin: Skin is warm. She is not diaphoretic.  Psychiatric: She has a normal mood  and affect.   CT of head was personally reviewed by me and shows diffuse movement but no obvious bleed noted  Assessment/Plan: 1.  Seizure-  Pt has family hx and one prior episode to this which is very highly suspicious for epilepsy.  There was an ictal yell prior to episode so we have to r/o focal injury.  Pt is also at the correct age for generalized epilepsy as well. -  MRI of brain w/wo contrast -  Keppra 1gm IV now then 728m q12h -  Keep Mg > 2, Ca > 8 and Na > 130 -  No driving or operating heavy machinery x 6 months -  D/c Buspar -  Pt can go home if MRI is negative but [facility] Elizabeth Shea 04/10/2015, 1:19 PM

## 2015-04-10 NOTE — Progress Notes (Signed)
Attempted to wean pt off sedation, pt became very agitated and combative, kicking with both legs. Will not follow any command. Turning head side by side, took 3 staff to hold for several minutes before calming her down by increasing her Sedation. propofol at 41 mcg/kg/min, fentanyl at 75 mcq/min.

## 2015-04-11 LAB — CBC
HCT: 40.1 % (ref 35.0–47.0)
Hemoglobin: 13.6 g/dL (ref 12.0–16.0)
MCH: 31.5 pg (ref 26.0–34.0)
MCHC: 33.9 g/dL (ref 32.0–36.0)
MCV: 93 fL (ref 80.0–100.0)
PLATELETS: 203 10*3/uL (ref 150–440)
RBC: 4.31 MIL/uL (ref 3.80–5.20)
RDW: 13.7 % (ref 11.5–14.5)
WBC: 10.4 10*3/uL (ref 3.6–11.0)

## 2015-04-11 LAB — BASIC METABOLIC PANEL
Anion gap: 5 (ref 5–15)
BUN: 12 mg/dL (ref 6–20)
CALCIUM: 8.9 mg/dL (ref 8.9–10.3)
CHLORIDE: 112 mmol/L — AB (ref 101–111)
CO2: 24 mmol/L (ref 22–32)
CREATININE: 0.64 mg/dL (ref 0.44–1.00)
GFR calc Af Amer: >60 mL/min (ref >60)
GFR calc non Af Amer: >60 mL/min (ref >60)
Glucose, Bld: 76 mg/dL (ref 65–99)
Potassium: 3.3 mmol/L — ABNORMAL LOW (ref 3.5–5.1)
SODIUM: 141 mmol/L (ref 135–145)

## 2015-04-11 LAB — PROLACTIN: Prolactin: 51.6 ng/mL — ABNORMAL HIGH (ref 4.8–23.3)

## 2015-04-11 MED ORDER — POTASSIUM CHLORIDE CRYS ER 20 MEQ PO TBCR
40.0000 meq | EXTENDED_RELEASE_TABLET | Freq: Once | ORAL | Status: AC
  Administered 2015-04-11: 40 meq via ORAL
  Filled 2015-04-11: qty 2

## 2015-04-11 MED ORDER — LEVETIRACETAM 750 MG PO TABS: 750.0000 mg | ORAL_TABLET | Freq: Two times a day (BID) | ORAL | Status: DC

## 2015-04-11 NOTE — Discharge Instructions (Signed)
Seizure, Adult A seizure is abnormal electrical activity in the brain. Seizures usually last from 30 seconds to 2 minutes. There are various types of seizures. Before a seizure, you may have a warning sensation (aura) that a seizure is about to occur. An aura may include the following symptoms:   Fear or anxiety.  Nausea.  Feeling like the room is spinning (vertigo).  Vision changes, such as seeing flashing lights or spots. Common symptoms during a seizure include:  A change in attention or behavior (altered mental status).  Convulsions with rhythmic jerking movements.  Drooling.  Rapid eye movements.  Grunting.  Loss of bladder and bowel control.  Bitter taste in the mouth.  Tongue biting. After a seizure, you may feel confused and sleepy. You may also have an injury resulting from convulsions during the seizure. HOME CARE INSTRUCTIONS   If you are given medicines, take them exactly as prescribed by your health care provider.  Keep all follow-up appointments as directed by your health care provider.  Do not swim or drive or engage in risky activity during which a seizure could cause further injury to you or others until your health care provider says it is OK.  Get adequate rest.  Teach friends and family what to do if you have a seizure. They should:  Lay you on the ground to prevent a fall.  Put a cushion under your head.  Loosen any tight clothing around your neck.  Turn you on your side. If vomiting occurs, this helps keep your airway clear.  Stay with you until you recover.  Know whether or not you need emergency care. SEEK IMMEDIATE MEDICAL CARE IF:  The seizure lasts longer than 5 minutes.  The seizure is severe or you do not wake up immediately after the seizure.  You have an altered mental status after the seizure.  You are having more frequent or worsening seizures. Someone should drive you to the emergency department or call local emergency  services (911 in [place] MAKE SURE YOU:  Understand these instructions.  Will watch your condition.  Will get help right away if you are not doing well or get worse. Document Released: 07/10/2000 Document Revised: 05/03/2013 Document Reviewed: 02/22/2013 [facility] Patient Information 2015 [place], [place]. This information is not intended to replace advice given to you by your health care provider. Make sure you discuss any questions you have with your health care provider.  Follow all MD discharge instructions. Take all medications as prescribed. Keep all follow up appointments. If you experience any symptoms that are of concern to you or that are bothersome to you, call your doctor. For all questions and/or concerns, call your doctor.   Regular diet. Activity as tolerated. Smoking cessation. Substance cessation.

## 2015-04-11 NOTE — Progress Notes (Signed)
Pt A and O x 4. VSS. Pt voiding with no difficulties. Pt tolerating diet well. Minimal complaints of pain with no meds given. IV removed intact, prescriptions given. Pt voiced understanding of discharge instructions with no further questions. Pt discharged via wheelchair with axillary.

## 2015-04-11 NOTE — Discharge Summary (Signed)
[facility] - [place] at [facility]   PATIENT NAME: Elizabeth Shea    MR#:  [id number]  DATE OF BIRTH:  06-02-1991  DATE OF ADMISSION:  04/09/2015 ADMITTING PHYSICIAN: Wyatt Haste, MD  DATE OF DISCHARGE: 04/11/2015 12:57 PM  PRIMARY CARE PHYSICIAN: Evelene Croon, MD    ADMISSION DIAGNOSIS:  Delirium [R41.0]   DISCHARGE DIAGNOSIS:  Epilepsy Substance abuse SECONDARY DIAGNOSIS:   Past Medical History  Diagnosis Date  . Asthma     HOSPITAL COURSE:   1. Acute encephalopathy: possible due to substance abuse/seizure. The patient was admitted for altered mental status. Per Dr. Katrinka Blazing, neurologist, suggested the patient has a seizure and started Oral 75 mg by mouth twice a day and follow-up neurologist as outpatient. Dr. Mat Carne packs, psychiatrist, suggest the patient has a substance abuse, which could be contributing to altered mental status. Substance abuse and cessation was counseled.  2.. Acute Respiratory failure: Due to above. The patient was intubated and admitted to ICU. She was extubated after weaning off sedation. She is alert, awake and oriented today.  3. Hypokalemia: Improved after supplement.  4. Tobacco abuse. Smoking cessation was counseled for 4 minutes.  DISCHARGE CONDITIONS:   Clinically stable, discharged to home today.  CONSULTS OBTAINED:  Treatment Team:  Audery Amel, MD Mellody Drown, MD  DRUG ALLERGIES:  No Known Allergies  DISCHARGE MEDICATIONS:   Discharge Medication List as of 04/11/2015 12:02 PM    START taking these medications   Details  levETIRAcetam (KEPPRA) 750 MG tablet Take 1 tablet (750 mg total) by mouth 2 (two) times daily., Starting 04/11/2015, Until Discontinued, Print      CONTINUE these medications which have NOT CHANGED   Details  gabapentin (NEURONTIN) 300 MG capsule Take 300 mg by mouth 2 (two) times daily., Until Discontinued, Historical Med    oxyCODONE-acetaminophen (ROXICET) 5-325 MG per  tablet Take 1 tablet by mouth every 6 (six) hours as needed., Starting 12/04/2014, Until Wed 12/04/15, Print      STOP taking these medications     busPIRone (BUSPAR) 10 MG tablet          DISCHARGE INSTRUCTIONS:    If you experience worsening of your admission symptoms, develop shortness of breath, life threatening emergency, suicidal or homicidal thoughts you must seek medical attention immediately by calling 911 or calling your MD immediately  if symptoms less severe.  You Must read complete instructions/literature along with all the possible adverse reactions/side effects for all the Medicines you take and that have been prescribed to you. Take any new Medicines after you have completely understood and accept all the possible adverse reactions/side effects.   Please note  You were cared for by a hospitalist during your hospital stay. If you have any questions about your discharge medications or the care you received while you were in the hospital after you are discharged, you can call the unit and asked to speak with the hospitalist on call if the hospitalist that took care of you is not available. Once you are discharged, your primary care physician will handle any further medical issues. Please note that NO REFILLS for any discharge medications will be authorized once you are discharged, as it is imperative that you return to your primary care physician (or establish a relationship with a primary care physician if you do not have one) for your aftercare needs so that they can reassess your need for medications and monitor your lab values.    Today  SUBJECTIVE   The patient complained of body pain and wanted the medication.   VITAL SIGNS:  Blood pressure 110/79, pulse 78, temperature 98.1 F (36.7 C), temperature source Oral, resp. rate 17, height 5\' 6"  (1.676 m), weight 78.5 kg (173 lb 1 oz), SpO2 100 %.  I/O:   Intake/Output Summary (Last 24 hours) at 04/11/15 1512 Last  data filed at 04/11/15 1217  Gross per 24 hour  Intake      0 ml  Output    850 ml  Net   -850 ml    PHYSICAL EXAMINATION:  GENERAL:  24 y.o.-year-old patient lying in the bed with no acute distress.  EYES: Pupils equal, round, reactive to light and accommodation. No scleral icterus. Extraocular muscles intact.  HEENT: Head atraumatic, normocephalic. Oropharynx and nasopharynx clear. Moist oral mucosa. NECK:  Supple, no jugular venous distention. No thyroid enlargement, no tenderness.  LUNGS: Normal breath sounds bilaterally, no wheezing, rales,rhonchi or crepitation. No use of accessory muscles of respiration.  CARDIOVASCULAR: S1, S2 normal. No murmurs, rubs, or gallops.  ABDOMEN: Soft, non-tender, non-distended. Bowel sounds present. No organomegaly or mass.  EXTREMITIES: No pedal edema, cyanosis, or clubbing.  NEUROLOGIC: Cranial nerves II through XII are intact. Muscle strength 5/5 in all extremities. Sensation intact. Gait not checked.  PSYCHIATRIC: The patient is alert and oriented x 3.  SKIN: No obvious rash, lesion, or ulcer.   DATA REVIEW:   CBC  Recent Labs Lab 04/11/15 0509  WBC 10.4  HGB 13.6  HCT 40.1  PLT 203    Chemistries   Recent Labs Lab 04/09/15 1624  04/10/15 0604 04/11/15 0509  NA 138  --  143 141  K 3.5  --  3.0* 3.3*  CL 108  --  113* 112*  CO2 14*  --  23 24  GLUCOSE 127*  --  87 76  BUN 12  --  13 12  CREATININE 0.80  < > 0.81 0.64  CALCIUM 9.5  --  8.9 8.9  MG  --   --  1.9  --   AST 47*  --   --   --   ALT 18  --   --   --   ALKPHOS 89  --   --   --   BILITOT 0.6  --   --   --   < > = values in this interval not displayed.  Cardiac Enzymes No results for input(s): TROPONINI in the last 168 hours.  Microbiology Results  Results for orders placed or performed during the hospital encounter of 04/09/15  MRSA PCR Screening     Status: None   Collection Time: 04/09/15 10:30 PM  Result Value Ref Range Status   MRSA by PCR NEGATIVE  NEGATIVE Final    Comment:        The GeneXpert MRSA Assay (FDA approved for NASAL specimens only), is one component of a comprehensive MRSA colonization surveillance program. It is not intended to diagnose MRSA infection nor to guide or monitor treatment for MRSA infections.     RADIOLOGY:  Ct Head Wo Contrast  04/09/2015   CLINICAL DATA:  Altered mental status.  EXAM: CT HEAD WITHOUT CONTRAST  TECHNIQUE: Contiguous axial images were obtained from the base of the skull through the vertex without intravenous contrast.  COMPARISON:  12/04/2014  FINDINGS: Sinuses/Soft tissues: Moderate to marked motion degradation throughout, despite multiple attempts. Partial ethmoid air cell opacification.  Intracranial: No gross acute intracranial abnormality. No mass  lesion, hemorrhage, hydrocephalus, acute infarct, intra-axial, or extra-axial fluid collection.  IMPRESSION: 1. Moderate to marked motion degradation, despite multiple attempts. 2. No gross acute intracranial abnormality. 3. Consider repeat when patient is able to hold still. 4. Sinus disease.   Electronically Signed   By: Jeronimo Greaves M.D.   On: 04/09/2015 22:00   Mr Laqueta Jean ZO Contrast  04/10/2015   CLINICAL DATA:  Seizure today.  Last seizure 3 years ago.  EXAM: MRI HEAD WITHOUT AND WITH CONTRAST  TECHNIQUE: Multiplanar, multiecho pulse sequences of the brain and surrounding structures were obtained without and with intravenous contrast.  CONTRAST:  16mL MULTIHANCE GADOBENATE DIMEGLUMINE 529 MG/ML IV SOLN  COMPARISON:  CT head without contrast 04/09/2015.  FINDINGS: No acute infarct, hemorrhage, or mass lesion is present. The ventricles are of normal size. No significant white matter disease is present. The basal ganglia and brainstem are within normal limits. The internal auditory canals are normal bilaterally. No significant extraaxial fluid collection is present.  Flow is present in the major intracranial arteries. The globes and orbits are  intact.  There is moderate opacification of ethmoid air cells and circumferential mucosal thickening within the frontal sinuses bilaterally. Mild mucosal thickening is seen in the maxillary sinuses. The mastoid air cells are clear.  Dedicated imaging of the temporal lobes demonstrate symmetric size and signal of the hippocampal structures.  The postcontrast images demonstrate no pathologic enhancement. The skullbase is within normal limits. 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 Midline structures are otherwise unremarkable.  IMPRESSION: 1. Normal MRI of the brain. No acute or focal lesion to explain seizures. 2. Moderate ethmoid and bilateral frontal sinus disease.   Electronically Signed   By: Marin Roberts M.D.   On: 04/10/2015 19:58   Dg Chest Port 1 View  04/10/2015   CLINICAL DATA:  Post intubation.  EXAM: PORTABLE CHEST - 1 VIEW  COMPARISON:  Chest radiograph obtained yesterday.  FINDINGS: The endotracheal tube is 2.5 cm from the carina. The cardiomediastinal contours are normal. The lungs are clear. Pulmonary vasculature is normal. No consolidation, pleural effusion, or pneumothorax. No acute osseous abnormalities are seen.  IMPRESSION: Endotracheal tube 2.5 cm from the carina.  The lungs are clear.   Electronically Signed   By: Rubye Oaks M.D.   On: 04/10/2015 02:03   Dg Chest Portable 1 View  04/09/2015   CLINICAL DATA:  Unresponsive.  Altered mental status.  EXAM: PORTABLE CHEST - 1 VIEW  COMPARISON:  12/04/2014  FINDINGS: Minimal motion degradation. Artifact degradation projects over the lung bases. Midline trachea. Normal heart size. No gross pleural fluid. No pneumothorax. Upper lungs are clear.  IMPRESSION: No acute cardiopulmonary disease.  Motion and artifact degradation over the lower chest.   Electronically Signed   By: Jeronimo Greaves M.D.   On: 04/09/2015 18:45        Management plans discussed with the patient, family and they are in agreement. I discussed with  Dr. Toni Amend.  CODE STATUS:     Code Status Orders        Start     Ordered   04/09/15 2136  Full code   Continuous     04/09/15 2136      TOTAL TIME TAKING CARE OF THIS PATIENT: 35  minutes.    Shaune Pollack M.D on 04/11/2015 at 3:12 PM  Between 7am to 6pm - Pager - [phone]  After 6pm go to [email or web address] - password [email or web address]  Hospitalists  Office  [phone]  CC: Primary care physician; Evelene Croon, MD

## 2015-04-11 NOTE — Progress Notes (Signed)
Neurology Note  S: Pt has no complaints but wants to go home.  ROS neg x 8 except per HPI  O: 98.1 110/79 78 18 Nl weight, NAD Normocephalic, oropharynx clear Supple, no JVD CTA B, no wheezing RRR, no murmur No C/C/E  Alert and oriented x 3, nl speech and language PERRLA, EOMI, face symmetric 5/5 B, nl tone  A/P: 1. Epilepsy-  Pt has had two episodes plus family hx of this;  Agitation is common after seizure -  Continue Keppra  BID -  Needs 8 hours of sleep per night -  No driving or operating heavy machinery x 6 months -  Will sign off, please call with questions -  Needs to f/u with [facility] in 3 months

## 2015-04-21 ENCOUNTER — Emergency Department
Admission: EM | Admit: 2015-04-21 | Discharge: 2015-04-21 | Disposition: A | Discharge status: Home / Self Care | Payer: Medicaid Other | Attending: Emergency Medicine | Admitting: Emergency Medicine

## 2015-04-21 ENCOUNTER — Encounter: Payer: Self-pay | Admitting: Emergency Medicine

## 2015-04-21 DIAGNOSIS — K08409 Partial loss of teeth, unspecified cause, unspecified class: Secondary | ICD-10-CM | POA: Diagnosis not present

## 2015-04-21 DIAGNOSIS — Z720 Tobacco use: Secondary | ICD-10-CM | POA: Diagnosis not present

## 2015-04-21 DIAGNOSIS — Z79899 Other long term (current) drug therapy: Secondary | ICD-10-CM | POA: Insufficient documentation

## 2015-04-21 DIAGNOSIS — K0889 Other specified disorders of teeth and supporting structures: Secondary | ICD-10-CM

## 2015-04-21 DIAGNOSIS — K088 Other specified disorders of teeth and supporting structures: Secondary | ICD-10-CM | POA: Insufficient documentation

## 2015-04-21 MED ORDER — TRAMADOL HCL 50 MG PO TABS: 50.0000 mg | ORAL_TABLET | Freq: Two times a day (BID) | ORAL | Status: DC

## 2015-04-21 NOTE — Discharge Instructions (Signed)
Dental Pain °Toothache is pain in or around a tooth. It may get worse with chewing or with cold or heat.  °HOME CARE °· Your dentist may use a numbing medicine during treatment. If so, you may need to avoid eating until the medicine wears off. Ask your dentist about this. °· Only take medicine as told by your dentist or doctor. °· Avoid chewing food near the painful tooth until after all treatment is done. Ask your dentist about this. °GET HELP RIGHT AWAY IF:  °· The problem gets worse or new problems appear. °· You have a fever. °· There is redness and puffiness (swelling) of the face, jaw, or neck. °· You cannot open your mouth. °· There is pain in the jaw. °· There is very bad pain that is not helped by medicine. °MAKE SURE YOU:  °· Understand these instructions. °· Will watch your condition. °· Will get help right away if you are not doing well or get worse. °Document Released: 12/30/2007 Document Revised: 10/05/2011 Document Reviewed: 12/30/2007 °ExitCare® Patient Information ©2015 [employer]. This information is not intended to replace advice given to you by your health care provider. Make sure you discuss any questions you have with your health care provider. ° °Dental Care and Dentist Visits °Dental care supports good overall health. Regular dental visits can also help you avoid dental pain, bleeding, infection, and other more serious health problems in the future. It is important to keep the mouth healthy because diseases in the teeth, gums, and other oral tissues can spread to other areas of the body. Some problems, such as diabetes, heart disease, and pre-term labor have been associated with poor oral health.  °See your dentist every 6 months. If you experience emergency problems such as a toothache or broken tooth, go to the dentist right away. If you see your dentist regularly, you may catch problems early. It is easier to be treated for problems in the early stages.  °WHAT TO EXPECT AT A DENTIST  VISIT  °Your dentist will look for many common oral health problems and recommend proper treatment. At your regular dental visit, you can expect: °· Gentle cleaning of the teeth and gums. This includes scraping and polishing. This helps to remove the sticky substance around the teeth and gums (plaque). Plaque forms in the mouth shortly after eating. Over time, plaque hardens on the teeth as tartar. If tartar is not removed regularly, it can cause problems. Cleaning also helps remove stains. °· Periodic X-rays. These pictures of the teeth and supporting bone will help your dentist assess the health of your teeth. °· Periodic fluoride treatments. Fluoride is a natural mineral shown to help strengthen teeth. Fluoride treatment involves applying a fluoride gel or varnish to the teeth. It is most commonly done in children. °· Examination of the mouth, tongue, jaws, teeth, and gums to look for any oral health problems, such as: °¨ Cavities (dental caries). This is decay on the tooth caused by plaque, sugar, and acid in the mouth. It is best to catch a cavity when it is small. °¨ Inflammation of the gums caused by plaque buildup (gingivitis). °¨ Problems with the mouth or malformed or misaligned teeth. °¨ Oral cancer or other diseases of the soft tissues or jaws.  °KEEP YOUR TEETH AND GUMS HEALTHY °For healthy teeth and gums, follow these general guidelines as well as your dentist's specific advice: °· Have your teeth professionally cleaned at the dentist every 6 months. °· Brush twice daily with a   fluoride toothpaste. °· Floss your teeth daily.  °· Ask your dentist if you need fluoride supplements, treatments, or fluoride toothpaste. °· Eat a healthy diet. Reduce foods and drinks with added sugar. °· Avoid smoking. °TREATMENT FOR ORAL HEALTH PROBLEMS °If you have oral health problems, treatment varies depending on the conditions present in your teeth and gums. °· Your caregiver will most likely recommend good oral hygiene  at each visit. °· For cavities, gingivitis, or other oral health disease, your caregiver will perform a procedure to treat the problem. This is typically done at a separate appointment. Sometimes your caregiver will refer you to another dental specialist for specific tooth problems or for surgery. °SEEK IMMEDIATE DENTAL CARE IF: °· You have pain, bleeding, or soreness in the gum, tooth, jaw, or mouth area. °· A permanent tooth becomes loose or separated from the gum socket. °· You experience a blow or injury to the mouth or jaw area. °Document Released: 03/25/2011 Document Revised: 10/05/2011 Document Reviewed: 03/25/2011 °ExitCare® Patient Information ©2015 [employer]. This information is not intended to replace advice given to you by your health care provider. Make sure you discuss any questions you have with your health care provider. ° °

## 2015-04-21 NOTE — ED Provider Notes (Signed)
[facility] Emergency Department Provider Note ____________________________________________  Time seen: 1540  I have reviewed the triage vital signs and the nursing notes.  HISTORY  Chief Complaint  Dental Pain  HPI Elizabeth Shea is a 24 y.o. female reports to the ED for evaluation of dental pain status post a dental extraction 3 days prior.She reports that she completed her pain medicines yesterday, but was not provided with any antibiotics. She is ultimately dosed a amoxicillin, from a friend. She denies any interim fevers, chills, or sweats. She also denies any gum swelling, or any difficulty swallowing.  Past Medical History  Diagnosis Date  . Asthma     Patient Active Problem List   Diagnosis Date Noted  . Opiate abuse, episodic 04/10/2015  . Cannabis abuse 04/10/2015  . Acute encephalopathy 04/09/2015    Past Surgical History  Procedure Laterality Date  . Cholecystectomy      Current Outpatient Rx  Name  Route  Sig  Dispense  Refill  . gabapentin (NEURONTIN) 300 MG capsule   Oral   Take 300 mg by mouth 2 (two) times daily.         Marland Kitchen levETIRAcetam (KEPPRA) 750 MG tablet   Oral   Take 1 tablet (750 mg total) by mouth 2 (two) times daily.   60 tablet   0   . oxyCODONE-acetaminophen (ROXICET) 5-325 MG per tablet   Oral   Take 1 tablet by mouth every 6 (six) hours as needed. Patient taking differently: Take 1 tablet by mouth every 6 (six) hours as needed for severe pain.    12 tablet   0   . traMADol (ULTRAM) 50 MG tablet   Oral   Take 1 tablet (50 mg total) by mouth 2 (two) times daily.   10 tablet   0    Allergies Review of patient's allergies indicates no known allergies.  Family History  Problem Relation Age of Onset  . Seizures Mother     Social History Social History  Substance Use Topics  . Smoking status: Current Every Day Smoker  . Smokeless tobacco: None  . Alcohol Use: No   Review of Systems  Constitutional:  Negative for fever. Eyes: Negative for visual changes. ENT: Negative for sore throat. Dental pain as above. Cardiovascular: Negative for chest pain. Respiratory: Negative for shortness of breath. Gastrointestinal: Negative for abdominal pain, vomiting and diarrhea. Genitourinary: Negative for dysuria. Musculoskeletal: Negative for back pain. Skin: Negative for rash. Neurological: Negative for headaches, focal weakness or numbness. ____________________________________________  PHYSICAL EXAM:  VITAL SIGNS: ED Triage Vitals  Enc Vitals Group     BP 04/21/15 1410 119/75 mmHg     Pulse Rate 04/21/15 1410 102     Resp 04/21/15 1410 20     Temp 04/21/15 1410 98.7 F (37.1 C)     Temp Source 04/21/15 1410 Oral     SpO2 04/21/15 1410 99 %     Weight 04/21/15 1410 137 lb (62.143 kg)     Height 04/21/15 1410  (1.6 m)     Head Cir --      Peak Flow --      Pain Score --      Pain Loc --      Pain Edu? --      Excl. in GC? --    Constitutional: Alert and oriented. Well appearing and in no distress. Eyes: Conjunctivae are normal. PERRL. Normal extraocular movements. ENT   Head: Normocephalic and atraumatic.  Nose: No congestion/rhinorrhea.   Mouth/Throat: Mucous membranes are moist. Uvula is midline tonsils are small. The left lower jaw with the recent extraction site at the second molar. No indication of dry socket, or buccal edema is appreciated.   Neck: Supple. No thyromegaly. Hematological/Lymphatic/Immunological: No cervical lymphadenopathy. Cardiovascular: Normal rate, regular rhythm.  Respiratory: Normal respiratory effort. No wheezes/rales/rhonchi. Gastrointestinal: Soft and nontender. No distention. Musculoskeletal: Nontender with normal range of motion in all extremities.  Neurologic:  Normal gait without ataxia. Normal speech and language. No gross focal neurologic deficits are appreciated. Skin:  Skin is warm, dry and intact. No rash noted. Psychiatric:  Mood and affect are normal. Patient exhibits appropriate insight and judgment. ____________________________________________  INITIAL IMPRESSION / ASSESSMENT AND PLAN / ED COURSE  Acute dental pain status post extraction. Patient provided with prescription for Ultram #10, and is encouraged to Dr. Pollie Friar, DDS for further management. ____________________________________________  FINAL CLINICAL IMPRESSION(S) / ED DIAGNOSES  Final diagnoses:  Pain, dental  S/P tooth extraction, unspecified edentulism      Lissa Hoard, PA-C 04/21/15 1613  Rockne Menghini, MD 05/03/15 [phone]

## 2015-07-10 ENCOUNTER — Encounter: Payer: Self-pay | Admitting: Emergency Medicine

## 2015-07-10 ENCOUNTER — Emergency Department: Payer: Medicaid Other

## 2015-07-10 ENCOUNTER — Emergency Department
Admission: EM | Admit: 2015-07-10 | Discharge: 2015-07-10 | Disposition: A | Discharge status: Home / Self Care | Payer: Medicaid Other | Attending: Emergency Medicine | Admitting: Emergency Medicine

## 2015-07-10 DIAGNOSIS — Y998 Other external cause status: Secondary | ICD-10-CM | POA: Insufficient documentation

## 2015-07-10 DIAGNOSIS — S6000XA Contusion of unspecified finger without damage to nail, initial encounter: Secondary | ICD-10-CM

## 2015-07-10 DIAGNOSIS — S60051A Contusion of right little finger without damage to nail, initial encounter: Secondary | ICD-10-CM | POA: Diagnosis not present

## 2015-07-10 DIAGNOSIS — Y9389 Activity, other specified: Secondary | ICD-10-CM | POA: Insufficient documentation

## 2015-07-10 DIAGNOSIS — Z79899 Other long term (current) drug therapy: Secondary | ICD-10-CM | POA: Insufficient documentation

## 2015-07-10 DIAGNOSIS — Y92009 Unspecified place in unspecified non-institutional (private) residence as the place of occurrence of the external cause: Secondary | ICD-10-CM | POA: Diagnosis not present

## 2015-07-10 DIAGNOSIS — S60811A Abrasion of right wrist, initial encounter: Secondary | ICD-10-CM | POA: Diagnosis not present

## 2015-07-10 DIAGNOSIS — F17200 Nicotine dependence, unspecified, uncomplicated: Secondary | ICD-10-CM | POA: Diagnosis not present

## 2015-07-10 DIAGNOSIS — S6991XA Unspecified injury of right wrist, hand and finger(s), initial encounter: Secondary | ICD-10-CM | POA: Diagnosis present

## 2015-07-10 MED ORDER — HYDROCODONE-ACETAMINOPHEN 5-325 MG PO TABS
1.0000 | ORAL_TABLET | Freq: Once | ORAL | Status: AC
  Administered 2015-07-10: 1 via ORAL
  Filled 2015-07-10: qty 1

## 2015-07-10 MED ORDER — HYDROCODONE-ACETAMINOPHEN 5-325 MG PO TABS: 1.0000 | ORAL_TABLET | ORAL | Status: DC | PRN

## 2015-07-10 NOTE — Discharge Instructions (Signed)
Contusion °A contusion is a deep bruise. Contusions happen when an injury causes bleeding under the skin. Symptoms of bruising include pain, swelling, and discolored skin. The skin may turn blue, purple, or yellow. °HOME CARE  °· Rest the injured area. °· If told, put ice on the injured area. °· Put ice in a plastic bag. °· Place a towel between your skin and the bag. °· Leave the ice on for 20 minutes, 2-3 times per day. °· If told, put light pressure (compression) on the injured area using an elastic bandage. Make sure the bandage is not too tight. Remove it and put it back on as told by your doctor. °· If possible, raise (elevate) the injured area above the level of your heart while you are sitting or lying down. °· Take over-the-counter and prescription medicines only as told by your doctor. °GET HELP IF: °· Your symptoms do not get better after several days of treatment. °· Your symptoms get worse. °· You have trouble moving the injured area. °GET HELP RIGHT AWAY IF:  °· You have very bad pain. °· You have a loss of feeling (numbness) in a hand or foot. °· Your hand or foot turns pale or cold. °  °This information is not intended to replace advice given to you by your health care provider. Make sure you discuss any questions you have with your health care provider. °  °Document Released: 12/30/2007 Document Revised: 04/03/2015 Document Reviewed: 11/28/2014 °Elsevier Interactive Patient Education ©2016 [employer] ° °Cryotherapy °Cryotherapy is when you put ice on your injury. Ice helps lessen pain and puffiness (swelling) after an injury. Ice works the best when you start using it in the first 24 to 48 hours after an injury. °HOME CARE °· Put a dry or damp towel between the ice pack and your skin. °· You may press gently on the ice pack. °· Leave the ice on for no more than 10 to 20 minutes at a time. °· Check your skin after 5 minutes to make sure your skin is okay. °· Rest at least 20 minutes between ice  pack uses. °· Stop using ice when your skin loses feeling (numbness). °· Do not use ice on someone who cannot tell you when it hurts. This includes small children and people with memory problems (dementia). °GET HELP RIGHT AWAY IF: °· You have white spots on your skin. °· Your skin turns blue or pale. °· Your skin feels waxy or hard. °· Your puffiness gets worse. °MAKE SURE YOU:  °· Understand these instructions. °· Will watch your condition. °· Will get help right away if you are not doing well or get worse. °  °This information is not intended to replace advice given to you by your health care provider. Make sure you discuss any questions you have with your health care provider. °  °Document Released: 12/30/2007 Document Revised: 10/05/2011 Document Reviewed: 03/05/2011 °Elsevier Interactive Patient Education ©2016 [employer] ° °
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
## 2015-08-16 ENCOUNTER — Encounter: Payer: Self-pay | Admitting: Emergency Medicine

## 2015-08-16 ENCOUNTER — Emergency Department
Admission: EM | Admit: 2015-08-16 | Discharge: 2015-08-16 | Disposition: A | Discharge status: Home / Self Care | Payer: No Typology Code available for payment source | Attending: Emergency Medicine | Admitting: Emergency Medicine

## 2015-08-16 ENCOUNTER — Emergency Department: Payer: No Typology Code available for payment source

## 2015-08-16 DIAGNOSIS — S4991XA Unspecified injury of right shoulder and upper arm, initial encounter: Secondary | ICD-10-CM | POA: Insufficient documentation

## 2015-08-16 DIAGNOSIS — S29001A Unspecified injury of muscle and tendon of front wall of thorax, initial encounter: Secondary | ICD-10-CM | POA: Diagnosis not present

## 2015-08-16 DIAGNOSIS — F17200 Nicotine dependence, unspecified, uncomplicated: Secondary | ICD-10-CM | POA: Insufficient documentation

## 2015-08-16 DIAGNOSIS — S199XXA Unspecified injury of neck, initial encounter: Secondary | ICD-10-CM | POA: Diagnosis not present

## 2015-08-16 DIAGNOSIS — Y9389 Activity, other specified: Secondary | ICD-10-CM | POA: Diagnosis not present

## 2015-08-16 DIAGNOSIS — I10 Essential (primary) hypertension: Secondary | ICD-10-CM | POA: Insufficient documentation

## 2015-08-16 DIAGNOSIS — S3991XA Unspecified injury of abdomen, initial encounter: Secondary | ICD-10-CM | POA: Insufficient documentation

## 2015-08-16 DIAGNOSIS — T148 Other injury of unspecified body region: Secondary | ICD-10-CM | POA: Insufficient documentation

## 2015-08-16 DIAGNOSIS — S8992XA Unspecified injury of left lower leg, initial encounter: Secondary | ICD-10-CM | POA: Diagnosis not present

## 2015-08-16 DIAGNOSIS — Y92410 Unspecified street and highway as the place of occurrence of the external cause: Secondary | ICD-10-CM | POA: Diagnosis not present

## 2015-08-16 DIAGNOSIS — S0990XA Unspecified injury of head, initial encounter: Secondary | ICD-10-CM | POA: Diagnosis not present

## 2015-08-16 DIAGNOSIS — Z3202 Encounter for pregnancy test, result negative: Secondary | ICD-10-CM | POA: Diagnosis not present

## 2015-08-16 DIAGNOSIS — Y998 Other external cause status: Secondary | ICD-10-CM | POA: Insufficient documentation

## 2015-08-16 DIAGNOSIS — T148XXA Other injury of unspecified body region, initial encounter: Secondary | ICD-10-CM

## 2015-08-16 DIAGNOSIS — S8991XA Unspecified injury of right lower leg, initial encounter: Secondary | ICD-10-CM | POA: Insufficient documentation

## 2015-08-16 DIAGNOSIS — Z79899 Other long term (current) drug therapy: Secondary | ICD-10-CM | POA: Insufficient documentation

## 2015-08-16 HISTORY — DX: Essential (primary) hypertension: I10

## 2015-08-16 LAB — HEPATIC FUNCTION PANEL
ALT: 15 U/L (ref 14–54)
AST: 14 U/L — ABNORMAL LOW (ref 15–41)
Albumin: 4.3 g/dL (ref 3.5–5.0)
Alkaline Phosphatase: 74 U/L (ref 38–126)
BILIRUBIN DIRECT: 0.1 mg/dL (ref 0.1–0.5)
BILIRUBIN INDIRECT: 0.5 mg/dL (ref 0.3–0.9)
Total Bilirubin: 0.6 mg/dL (ref 0.3–1.2)
Total Protein: 7.2 g/dL (ref 6.5–8.1)

## 2015-08-16 LAB — BASIC METABOLIC PANEL
ANION GAP: 6 (ref 5–15)
BUN: 16 mg/dL (ref 6–20)
CALCIUM: 9.2 mg/dL (ref 8.9–10.3)
CO2: 25 mmol/L (ref 22–32)
Chloride: 106 mmol/L (ref 101–111)
Creatinine, Ser: 0.67 mg/dL (ref 0.44–1.00)
GFR calc Af Amer: >60 mL/min (ref >60)
GLUCOSE: 92 mg/dL (ref 65–99)
POTASSIUM: 3.5 mmol/L (ref 3.5–5.1)
SODIUM: 137 mmol/L (ref 135–145)

## 2015-08-16 LAB — CBC WITH DIFFERENTIAL/PLATELET
BASOS ABS: 0.1 10*3/uL (ref 0–0.1)
BASOS PCT: 1 %
Eosinophils Absolute: 0.2 10*3/uL (ref 0–0.7)
Eosinophils Relative: 2 %
HEMATOCRIT: 42.8 % (ref 35.0–47.0)
HEMOGLOBIN: 14.4 g/dL (ref 12.0–16.0)
Lymphocytes Relative: 33 %
Lymphs Abs: 3.1 10*3/uL (ref 1.0–3.6)
MCH: 30.7 pg (ref 26.0–34.0)
MCHC: 33.6 g/dL (ref 32.0–36.0)
MCV: 91.3 fL (ref 80.0–100.0)
Monocytes Absolute: 0.6 10*3/uL (ref 0.2–0.9)
Monocytes Relative: 7 %
NEUTROS ABS: 5.3 10*3/uL (ref 1.4–6.5)
NEUTROS PCT: 57 %
Platelets: 234 10*3/uL (ref 150–440)
RBC: 4.69 MIL/uL (ref 3.80–5.20)
RDW: 12.8 % (ref 11.5–14.5)
WBC: 9.4 10*3/uL (ref 3.6–11.0)

## 2015-08-16 LAB — POCT PREGNANCY, URINE: Preg Test, Ur: NEGATIVE

## 2015-08-16 MED ORDER — HYDROCODONE-ACETAMINOPHEN 5-325 MG PO TABS: 1.0000 | ORAL_TABLET | Freq: Four times a day (QID) | ORAL | Status: AC | PRN

## 2015-08-16 MED ORDER — MORPHINE SULFATE (PF) 4 MG/ML IV SOLN
4.0000 mg | Freq: Once | INTRAVENOUS | Status: AC
  Administered 2015-08-16: 4 mg via INTRAVENOUS
  Filled 2015-08-16: qty 1

## 2015-08-16 MED ORDER — OXYCODONE-ACETAMINOPHEN 5-325 MG PO TABS
1.0000 | ORAL_TABLET | Freq: Once | ORAL | Status: AC
  Administered 2015-08-16: 1 via ORAL
  Filled 2015-08-16: qty 1

## 2015-08-16 MED ORDER — IOHEXOL 350 MG/ML SOLN
125.0000 mL | Freq: Once | INTRAVENOUS | Status: AC | PRN
  Administered 2015-08-16: 125 mL via INTRAVENOUS

## 2015-08-16 MED ORDER — ONDANSETRON HCL 4 MG/2ML IJ SOLN
4.0000 mg | Freq: Once | INTRAMUSCULAR | Status: AC
  Administered 2015-08-16: 4 mg via INTRAVENOUS
  Filled 2015-08-16: qty 2

## 2015-08-16 NOTE — ED Notes (Signed)
Patient transported to CT 
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
## 2015-08-19 ENCOUNTER — Encounter: Payer: Self-pay | Admitting: Emergency Medicine

## 2015-08-19 ENCOUNTER — Emergency Department
Admission: EM | Admit: 2015-08-19 | Discharge: 2015-08-19 | Disposition: A | Discharge status: Home / Self Care | Payer: No Typology Code available for payment source | Attending: Emergency Medicine | Admitting: Emergency Medicine

## 2015-08-19 DIAGNOSIS — I10 Essential (primary) hypertension: Secondary | ICD-10-CM | POA: Diagnosis not present

## 2015-08-19 DIAGNOSIS — Z79899 Other long term (current) drug therapy: Secondary | ICD-10-CM | POA: Insufficient documentation

## 2015-08-19 DIAGNOSIS — S199XXD Unspecified injury of neck, subsequent encounter: Secondary | ICD-10-CM | POA: Insufficient documentation

## 2015-08-19 DIAGNOSIS — F17200 Nicotine dependence, unspecified, uncomplicated: Secondary | ICD-10-CM | POA: Diagnosis not present

## 2015-08-19 DIAGNOSIS — S299XXD Unspecified injury of thorax, subsequent encounter: Secondary | ICD-10-CM | POA: Insufficient documentation

## 2015-08-19 DIAGNOSIS — M791 Myalgia, unspecified site: Secondary | ICD-10-CM

## 2015-08-19 MED ORDER — METHOCARBAMOL 500 MG PO TABS
1000.0000 mg | ORAL_TABLET | Freq: Once | ORAL | Status: AC
  Administered 2015-08-19: 1000 mg via ORAL
  Filled 2015-08-19: qty 2

## 2015-08-19 MED ORDER — OXYCODONE-ACETAMINOPHEN 7.5-325 MG PO TABS: 1.0000 | ORAL_TABLET | Freq: Four times a day (QID) | ORAL | Status: DC | PRN

## 2015-08-19 MED ORDER — METHOCARBAMOL 750 MG PO TABS: 1500.0000 mg | ORAL_TABLET | Freq: Four times a day (QID) | ORAL | Status: DC

## 2015-08-19 MED ORDER — IBUPROFEN 800 MG PO TABS
800.0000 mg | ORAL_TABLET | Freq: Once | ORAL | Status: AC
  Administered 2015-08-19: 800 mg via ORAL
  Filled 2015-08-19: qty 1

## 2015-08-19 MED ORDER — IBUPROFEN 800 MG PO TABS: 800.0000 mg | ORAL_TABLET | Freq: Three times a day (TID) | ORAL | Status: DC | PRN

## 2015-08-19 MED ORDER — OXYCODONE-ACETAMINOPHEN 5-325 MG PO TABS
1.0000 | ORAL_TABLET | Freq: Once | ORAL | Status: AC
  Administered 2015-08-19: 1 via ORAL
  Filled 2015-08-19: qty 1

## 2015-08-19 NOTE — Discharge Instructions (Signed)

## 2015-08-19 NOTE — ED Notes (Signed)
Pt presents to ED with middle back pain and left neck pain.
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
## 2015-10-28 ENCOUNTER — Emergency Department
Admission: EM | Admit: 2015-10-28 | Discharge: 2015-10-28 | Disposition: A | Discharge status: Home / Self Care | Payer: Medicaid Other | Attending: Emergency Medicine | Admitting: Emergency Medicine

## 2015-10-28 DIAGNOSIS — Z79899 Other long term (current) drug therapy: Secondary | ICD-10-CM | POA: Insufficient documentation

## 2015-10-28 DIAGNOSIS — R300 Dysuria: Secondary | ICD-10-CM | POA: Diagnosis present

## 2015-10-28 DIAGNOSIS — N3001 Acute cystitis with hematuria: Secondary | ICD-10-CM | POA: Insufficient documentation

## 2015-10-28 DIAGNOSIS — F17200 Nicotine dependence, unspecified, uncomplicated: Secondary | ICD-10-CM | POA: Diagnosis not present

## 2015-10-28 DIAGNOSIS — J45909 Unspecified asthma, uncomplicated: Secondary | ICD-10-CM | POA: Diagnosis not present

## 2015-10-28 DIAGNOSIS — I10 Essential (primary) hypertension: Secondary | ICD-10-CM | POA: Insufficient documentation

## 2015-10-28 DIAGNOSIS — N3000 Acute cystitis without hematuria: Secondary | ICD-10-CM

## 2015-10-28 LAB — URINALYSIS COMPLETE WITH MICROSCOPIC (ARMC ONLY)
BACTERIA UA: NONE SEEN
Bilirubin Urine: NEGATIVE
GLUCOSE, UA: NEGATIVE mg/dL
Ketones, ur: NEGATIVE mg/dL
Nitrite: POSITIVE — AB
PH: 6 (ref 5.0–8.0)
PROTEIN: 100 mg/dL — AB
SPECIFIC GRAVITY, URINE: 1.024 (ref 1.005–1.030)

## 2015-10-28 LAB — POCT PREGNANCY, URINE: PREG TEST UR: NEGATIVE

## 2015-10-28 MED ORDER — SULFAMETHOXAZOLE-TRIMETHOPRIM 800-160 MG PO TABS: 1.0000 | ORAL_TABLET | Freq: Two times a day (BID) | ORAL | Status: DC

## 2015-10-28 MED ORDER — PHENAZOPYRIDINE HCL 200 MG PO TABS: 200.0000 mg | ORAL_TABLET | Freq: Three times a day (TID) | ORAL | Status: AC | PRN

## 2015-10-28 NOTE — ED Provider Notes (Signed)
CSN: [id number]     Arrival date & time 10/28/15  1552 History   First MD Initiated Contact with Patient 10/28/15 1651     Chief Complaint  Patient presents with  . Hematuria  . Dysuria     (Consider location/radiation/quality/duration/timing/severity/associated sxs/prior Treatment) HPI 25 year old female who presents to the emergency department for evaluation of dysuria with intermittent hematuria for one week. She has a history of urinary tract of urinary tract infections but it is been sometime since she had the last one.  Past Medical History  Diagnosis Date  . Asthma   . Hypertension    Past Surgical History  Procedure Laterality Date  . Cholecystectomy    . Cesarean section     Family History  Problem Relation Age of Onset  . Seizures Mother    Social History  Substance Use Topics  . Smoking status: Current Every Day Smoker  . Smokeless tobacco: None  . Alcohol Use: No   OB History    No data available     Review of Systems  Constitutional: Negative for fever.  HENT: Negative.   Respiratory: Negative.   Gastrointestinal: Negative for diarrhea.  Genitourinary: Positive for dysuria, frequency and hematuria. Negative for flank pain.  Neurological: Negative for headaches.      Allergies  Tramadol  Home Medications   Prior to Admission medications   Medication Sig Start Date End Date Taking? Authorizing Provider  acetaminophen (TYLENOL) 325 MG tablet Take 650 mg by mouth every 6 (six) hours as needed for mild pain or headache.    Historical Provider, MD  gabapentin (NEURONTIN) 300 MG capsule Take 300 mg by mouth 2 (two) times daily.    Historical Provider, MD  HYDROcodone-acetaminophen (NORCO/VICODIN) 5-325 MG tablet Take 1 tablet by mouth every 4 (four) hours as needed for moderate pain. Patient not taking: Reported on 08/16/2015 07/10/15   Tommi Rumps, PA-C  HYDROcodone-acetaminophen (NORCO/VICODIN) 5-325 MG tablet Take 1-2 tablets by mouth every 6  (six) hours as needed for moderate pain. 08/16/15 08/15/16  Arnaldo Natal, MD  ibuprofen (ADVIL,MOTRIN) 800 MG tablet Take 1 tablet (800 mg total) by mouth every 8 (eight) hours as needed for moderate pain. 08/19/15   Joni Reining, PA-C  levETIRAcetam (KEPPRA) 750 MG tablet Take 1 tablet (750 mg total) by mouth 2 (two) times daily. Patient not taking: Reported on 08/16/2015 04/11/15   Shaune Pollack, MD  methocarbamol (ROBAXIN-750) 750 MG tablet Take 2 tablets (1,500 mg total) by mouth 4 (four) times daily. 08/19/15   Joni Reining, PA-C  oxyCODONE-acetaminophen (PERCOCET) 7.5-325 MG tablet Take 1 tablet by mouth every 6 (six) hours as needed for severe pain. 08/19/15   Joni Reining, PA-C  phenazopyridine (PYRIDIUM) 200 MG tablet Take 1 tablet (200 mg total) by mouth 3 (three) times daily as needed for pain. 10/28/15 10/27/16  Chinita Pester, FNP  sulfamethoxazole-trimethoprim (BACTRIM DS,SEPTRA DS) 800-160 MG tablet Take 1 tablet by mouth 2 (two) times daily. 10/28/15   Christopher Hink B Jalacia Mattila, FNP   BP 114/80 mmHg  Pulse 93  Temp(Src) 98.3 F (36.8 C) (Oral)  Resp 20  Ht  (1.6 m)  Wt 79.833 kg  BMI 31.18 kg/m2  SpO2 98%  LMP 10/21/2015 Physical Exam  Constitutional: She is oriented to person, place, and time. Vital signs are normal. She appears well-developed and well-nourished.  HENT:  Head: Normocephalic.  Eyes: EOM are normal.  Neck: Normal range of motion.  Pulmonary/Chest: Effort normal.  Abdominal: Soft. There is no tenderness.  Musculoskeletal: Normal range of motion.       Back:  Neurological: She is alert and oriented to person, place, and time.  Skin: Skin is warm and dry.  Psychiatric: Her behavior is normal. Thought content normal.  Nursing note and vitals reviewed.   ED Course  Procedures (including critical care time) Labs Review Labs Reviewed  URINALYSIS COMPLETEWITH MICROSCOPIC ([facility] ONLY) - Abnormal; Notable for the following:    Color, Urine AMBER (*)    APPearance  TURBID (*)    Hgb urine dipstick 3+ (*)    Protein, ur 100 (*)    Nitrite POSITIVE (*)    Leukocytes, UA 3+ (*)    Squamous Epithelial / LPF 6-30 (*)    All other components within normal limits  POC URINE PREG, ED  POCT PREGNANCY, URINE    Imaging Review No results found. I have personally reviewed and evaluated these images and lab results as part of my medical decision-making.   EKG Interpretation None      MDM   Final diagnoses:  Acute cystitis without hematuria    Patient was advised to take the Bactrim for 5 days is prescribed. She was also given a prescription for Pyridium 20 encouraged to follow up with her primary care providers in 7-10 days for a repeat urinalysis to make sure that the infection is cleared.    Chinita PesterCari B Sherlyne Crownover, FNP 10/28/15 1759  Phineas SemenGraydon Goodman, MD 10/28/15 [phone]

## 2015-10-28 NOTE — Discharge Instructions (Signed)

## 2015-10-28 NOTE — ED Notes (Signed)
AAOx3.  Skin warm and dry.  NAD 

## 2015-10-28 NOTE — ED Notes (Signed)
Pt arrives to ER via POV c/o dysuria and hematuria intermittently. Sx X 1 week. Pt alert and oriented X4, active, cooperative, pt in NAD. RR even and unlabored, color WNL.

## 2016-03-13 ENCOUNTER — Encounter: Payer: Self-pay | Admitting: Emergency Medicine

## 2016-03-13 ENCOUNTER — Emergency Department
Admission: EM | Admit: 2016-03-13 | Discharge: 2016-03-13 | Disposition: A | Discharge status: Home / Self Care | Payer: Medicaid Other | Attending: Emergency Medicine | Admitting: Emergency Medicine

## 2016-03-13 ENCOUNTER — Emergency Department: Payer: Medicaid Other

## 2016-03-13 DIAGNOSIS — J45909 Unspecified asthma, uncomplicated: Secondary | ICD-10-CM | POA: Insufficient documentation

## 2016-03-13 DIAGNOSIS — F17200 Nicotine dependence, unspecified, uncomplicated: Secondary | ICD-10-CM | POA: Diagnosis not present

## 2016-03-13 DIAGNOSIS — I10 Essential (primary) hypertension: Secondary | ICD-10-CM | POA: Insufficient documentation

## 2016-03-13 DIAGNOSIS — N644 Mastodynia: Secondary | ICD-10-CM | POA: Insufficient documentation

## 2016-03-13 MED ORDER — SULFAMETHOXAZOLE-TRIMETHOPRIM 800-160 MG PO TABS: 1.0000 | ORAL_TABLET | Freq: Two times a day (BID) | ORAL | 0 refills | Status: DC

## 2016-03-13 MED ORDER — MELOXICAM 15 MG PO TABS: 15.0000 mg | ORAL_TABLET | Freq: Every day | ORAL | 0 refills | Status: AC

## 2016-03-13 NOTE — ED Notes (Signed)
Pt presents with reports of increasing pain to known fibrocystic lump in right breast. Pt reports the pain increases when her nipple hardens. Pt states recently had a mammogram.

## 2016-03-13 NOTE — ED Provider Notes (Signed)
[place]: [facility] Emergency Room ____________________________________________  Time seen: Approximately 9:50 AM  I have reviewed the triage vital signs and the nursing notes.   HISTORY  Chief Complaint Breast Problem   HPI Elizabeth Shea is a 25 y.o. female percent for the complaints of right breast pain. Patient reports this is a gradual onset over the last week. Patient reports that she feels like she is having swelling directly behind her right nipple and states that this is very tender. Patient reports that she feels like she can palpate a hard lump in the same area intermittently. Patient reports she has had a similar history in the past but she had a mammogram and was told that she had a fibrocystic lump that was benign reports she then had to take antibiotics, the patient reports this is in a different area.  Denies pain radiation. Denies trauma or direct injury. Denies any other pain. Reports felt like she has had some chills and warm sensation but denies known fevers. Reports feels well otherwise. Denies any other complaints. Denies chest pain, shortness of breath, abdominal pain, dysuria, neck pain, back pain, headaches, weakness or extremity pain.  PCP: Lacie Scotts No LMP recorded. Patient has had an implant. Patient reports last menstrual was 3 months ago. States usually has a menstrual with her Implanon but not always.   Past Medical History:  Diagnosis Date  . Asthma   . Hypertension     Patient Active Problem List   Diagnosis Date Noted  . Opiate abuse, episodic 04/10/2015  . Cannabis abuse 04/10/2015  . Acute encephalopathy 04/09/2015    Past Surgical History:  Procedure Laterality Date  . CESAREAN SECTION    . CHOLECYSTECTOMY       No current facility-administered medications for this encounter.   Current Outpatient Prescriptions:  .  acetaminophen (TYLENOL) 325 MG tablet, Take 650 mg by mouth every 6 (six) hours as needed for mild pain or  headache., Disp: , Rfl:  .  gabapentin (NEURONTIN) 300 MG capsule, Take 300 mg by mouth 2 (two) times daily., Disp: , Rfl:  .  HYDROcodone-acetaminophen (NORCO/VICODIN) 5-325 MG tablet, Take 1 tablet by mouth every 4 (four) hours as needed for moderate pain. (Patient not taking: Reported on 08/16/2015), Disp: 20 tablet, Rfl: 0 .  HYDROcodone-acetaminophen (NORCO/VICODIN) 5-325 MG tablet, Take 1-2 tablets by mouth every 6 (six) hours as needed for moderate pain., Disp: 4 tablet, Rfl: 0 .  ibuprofen (ADVIL,MOTRIN) 800 MG tablet, Take 1 tablet (800 mg total) by mouth every 8 (eight) hours as needed for moderate pain., Disp: 15 tablet, Rfl: 0 .  levETIRAcetam (KEPPRA) 750 MG tablet, Take 1 tablet (750 mg total) by mouth 2 (two) times daily. (Patient not taking: Reported on 08/16/2015), Disp: 60 tablet, Rfl: 0 .  meloxicam (MOBIC) 15 MG tablet, Take 1 tablet (15 mg total) by mouth daily., Disp: 15 tablet, Rfl: 0 .  methocarbamol (ROBAXIN-750) 750 MG tablet, Take 2 tablets (1,500 mg total) by mouth 4 (four) times daily., Disp: 40 tablet, Rfl: 0 .  oxyCODONE-acetaminophen (PERCOCET) 7.5-325 MG tablet, Take 1 tablet by mouth every 6 (six) hours as needed for severe pain., Disp: 12 tablet, Rfl: 0 .  phenazopyridine (PYRIDIUM) 200 MG tablet, Take 1 tablet (200 mg total) by mouth 3 (three) times daily as needed for pain., Disp: 9 tablet, Rfl: 0 .  sulfamethoxazole-trimethoprim (BACTRIM DS,SEPTRA DS) 800-160 MG tablet, Take 1 tablet by mouth 2 (two) times daily., Disp: 20 tablet, Rfl: 0  Allergies Tramadol  Family History  Problem Relation Age of Onset  . Seizures Mother     Social History Social History  Substance Use Topics  . Smoking status: Current Every Day Smoker  . Smokeless tobacco: Not on file  . Alcohol use No    Review of Systems Constitutional: No fever/chills Eyes: No visual changes. ENT: No sore throat. Cardiovascular: Denies chest pain. Respiratory: Denies shortness of  breath. Gastrointestinal: No abdominal pain.  No nausea, no vomiting.  No diarrhea.  No constipation. Genitourinary: Negative for dysuria. Musculoskeletal: Negative for back pain. Skin: Negative for rash. Neurological: Negative for headaches, focal weakness or numbness.  10-point ROS otherwise negative.  ____________________________________________   PHYSICAL EXAM:  VITAL SIGNS: ED Triage Vitals [03/13/16 0904]  Enc Vitals Group     BP      Pulse Rate 76     Resp 18     Temp 98 F (36.7 C)     Temp Source Oral     SpO2 97 %     Weight 180 lb (81.6 kg)     Height 5\' 4"  (1.626 m)     Head Circumference      Peak Flow      Pain Score 10     Pain Loc      Pain Edu?      Excl. in GC?     Constitutional: Alert and oriented. Well appearing and in no acute distress. Eyes: Conjunctivae are normal. PERRL. EOMI. ENT      Head: Normocephalic and atraumatic.      Nose: No congestion/rhinnorhea.      Mouth/Throat: Mucous membranes are moist.Oropharynx non-erythematous. Neck: No stridor. Supple without meningismus.  Hematological/Lymphatic/Immunilogical: No cervical lymphadenopathy. Cardiovascular: Normal rate, regular rhythm. Grossly normal heart sounds.  Good peripheral circulation. Respiratory: Normal respiratory effort without tachypnea nor retractions. Breath sounds are clear and equal bilaterally. No wheezes/rales/rhonchi.. Gastrointestinal: Soft and nontender. No distention. Normal Bowel sounds. No CVA tenderness. Musculoskeletal:  Nontender with normal range of motion in all extremities. No midline cervical, thoracic or lumbar tenderness to palpation. Bilateral pedal pulses equal and easily palpated. Breast: Left breast nontender, no masses or nodules palpated, no erythema or skin changes noted. Right breast examined, moderate pain directly at nipple with mild induration and same area, no nipple discharge, no nipple drainage, no fluctuance or drainage, no dimpling, no erythema  or surrounding erythema, no other masses, nodules or abnormality palpated, No other tenderness palpated.  Neurologic:  Normal speech and language. No gross focal neurologic deficits are appreciated. Speech is normal. No gait instability.  Skin:  Skin is warm, dry and intact. No rash noted. Psychiatric: Mood and affect are normal. Speech and behavior are normal. Patient exhibits appropriate insight and judgment   ___________________________________________   LABS (all labs ordered are listed, but only abnormal results are displayed)  Labs Reviewed - No data to display  Radiology  EXAM: ULTRASOUND OF THE RIGHT BREAST  COMPARISON:  None.  FINDINGS: On physical exam, there is no discrete palpable lump identified in the right breast, though significant tenderness is elicited with palpation. No erythema or rashes are identified on the right breast.  Targeted ultrasound is performed, showing normal fibroglandular tissue in the subareolar right breast in the area of maximal pain. Normal fibroglandular tissue is identified. No evidence of a fluid collection.  IMPRESSION: 1. Normal targeted right breast ultrasound. No evidence of abscess.  RECOMMENDATION: 1. Clinical follow-up is recommended for the right breast pain. Any further  workup should be based on clinical grounds.  2. Screening mammogram at age 25 unless there are persistent or intervening clinical concerns. (Code:SM-B-40A)  I have discussed the findings and recommendations with the patient. Results were also provided in writing at the conclusion of the visit. If applicable, a reminder letter will be sent to the patient regarding the next appointment.  BI-RADS CATEGORY  1: Negative   Electronically Signed   By: Frederico HammanMichelle  Collins M.D.   On: 03/13/2016 10:53   PROCEDURES Procedures    INITIAL IMPRESSION / ASSESSMENT AND PLAN / ED COURSE  Pertinent labs & imaging results that were available during my  care of the patient were reviewed by me and considered in my medical decision making (see chart for details).  Well-appearing patient. No acute distress. Presents for the complaints of right breast and right nipple pain. Reports gradual onset over the past week. Slight induration noted directly behind nipple. Discussed in detail with patient will evaluate by ultrasound to evaluate for abscess. Concern for early infection, rule out abscess.   Ultrasound negative per radiology. No abscess noted. Discussed in detail with patient as concern for possible early infection, will place patient on oral Bactrim and daily Mobic. Encouraged patient to follow-up with her primary care physician within 2-3 days for follow-up and further evaluation. Discussed with patient she may need outpatient mammogram again. Encouraged rest, fluids, good supporting bra. Encouraged close monitoring. Discussed strict follow-up and return parameters.   Discussed follow up with Primary care physician this week. Discussed follow up and return parameters including no resolution or any worsening concerns. Patient verbalized understanding and agreed to plan.   ____________________________________________   FINAL CLINICAL IMPRESSION(S) / ED DIAGNOSES  Final diagnoses:  Breast pain  Breast pain, right     Discharge Medication List as of 03/13/2016 11:20 AM    START taking these medications   Details  meloxicam (MOBIC) 15 MG tablet Take 1 tablet (15 mg total) by mouth daily., Starting Fri 03/13/2016, Until Sat 03/13/2017, Print        Note: This dictation was prepared with Dragon dictation along with smaller phrase technology. Any transcriptional errors that result from this process are unintentional.    Clinical Course      Elizabeth DillsLindsey Jigar Zielke, NP 03/13/16 1239    Nita Sicklearolina Veronese, MD 03/13/16 [phone]

## 2016-03-13 NOTE — ED Triage Notes (Signed)
Pt has lump on right breast. Pt has had mammogram in past for previous lump. This has been present 2 weeks.

## 2016-03-13 NOTE — Discharge Instructions (Signed)
Take medication as prescribed. Rest. Drink plenty of fluids.   Follow up with your primary care physician this week in 2-3 days.   Return to ER for fevers, increased pain, new or worsening concerns.
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
## 2016-06-29 ENCOUNTER — Encounter: Payer: Self-pay | Admitting: Medical Oncology

## 2016-06-29 ENCOUNTER — Emergency Department
Admission: EM | Admit: 2016-06-29 | Discharge: 2016-06-29 | Disposition: A | Discharge status: Home / Self Care | Payer: Medicaid Other | Attending: Emergency Medicine | Admitting: Emergency Medicine

## 2016-06-29 DIAGNOSIS — Y9389 Activity, other specified: Secondary | ICD-10-CM | POA: Insufficient documentation

## 2016-06-29 DIAGNOSIS — S51812A Laceration without foreign body of left forearm, initial encounter: Secondary | ICD-10-CM | POA: Diagnosis present

## 2016-06-29 DIAGNOSIS — Y999 Unspecified external cause status: Secondary | ICD-10-CM | POA: Insufficient documentation

## 2016-06-29 DIAGNOSIS — W268XXA Contact with other sharp object(s), not elsewhere classified, initial encounter: Secondary | ICD-10-CM | POA: Diagnosis not present

## 2016-06-29 DIAGNOSIS — Y929 Unspecified place or not applicable: Secondary | ICD-10-CM | POA: Insufficient documentation

## 2016-06-29 DIAGNOSIS — Z23 Encounter for immunization: Secondary | ICD-10-CM | POA: Insufficient documentation

## 2016-06-29 DIAGNOSIS — F17200 Nicotine dependence, unspecified, uncomplicated: Secondary | ICD-10-CM | POA: Diagnosis not present

## 2016-06-29 DIAGNOSIS — Z79899 Other long term (current) drug therapy: Secondary | ICD-10-CM | POA: Diagnosis not present

## 2016-06-29 DIAGNOSIS — J45909 Unspecified asthma, uncomplicated: Secondary | ICD-10-CM | POA: Insufficient documentation

## 2016-06-29 DIAGNOSIS — I10 Essential (primary) hypertension: Secondary | ICD-10-CM | POA: Insufficient documentation

## 2016-06-29 MED ORDER — TETANUS-DIPHTH-ACELL PERTUSSIS 5-2.5-18.5 LF-MCG/0.5 IM SUSP
0.5000 mL | Freq: Once | INTRAMUSCULAR | Status: AC
  Administered 2016-06-29: 0.5 mL via INTRAMUSCULAR
  Filled 2016-06-29: qty 0.5
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
## 2016-06-29 NOTE — Discharge Instructions (Signed)
Advised ibuprofen or Tylenol for minor pain.

## 2016-06-29 NOTE — ED Provider Notes (Signed)
[facility] Emergency Department Provider Note    ____________________________________________   First MD Initiated Contact with Patient 06/29/16 1202     (approximate)  I have reviewed the triage vital signs and the nursing notes.   HISTORY   Chief Complaint Laceration    HPI Elizabeth Shea is a 25 y.o. female patient reports to the ED secondary to laceration to the left forearm. Patient said is that occurred last night are removed sheet metal.Patient state hemorrhage and was easily control with direct pressure. Patient denies loss sensation or loss of function of the left upper extremity. Patient rates the pain as 8/10. Patient described a pain as "sharp".   Past Medical History:  Diagnosis Date  . Asthma   . Hypertension     Patient Active Problem List   Diagnosis Date Noted  . Opiate abuse, episodic 04/10/2015  . Cannabis abuse 04/10/2015  . Acute encephalopathy 04/09/2015    Past Surgical History:  Procedure Laterality Date  . CESAREAN SECTION    . CHOLECYSTECTOMY      Prior to Admission medications   Medication Sig Start Date End Date Taking? Authorizing Provider  acetaminophen (TYLENOL) 325 MG tablet Take 650 mg by mouth every 6 (six) hours as needed for mild pain or headache.    Historical Provider, MD  gabapentin (NEURONTIN) 300 MG capsule Take 300 mg by mouth 2 (two) times daily.    Historical Provider, MD  HYDROcodone-acetaminophen (NORCO/VICODIN) 5-325 MG tablet Take 1 tablet by mouth every 4 (four) hours as needed for moderate pain. Patient not taking: Reported on 08/16/2015 07/10/15   Tommi Rumpshonda L Summers, PA-C  HYDROcodone-acetaminophen (NORCO/VICODIN) 5-325 MG tablet Take 1-2 tablets by mouth every 6 (six) hours as needed for moderate pain. 08/16/15 08/15/16  Arnaldo NatalPaul F Malinda, MD  ibuprofen (ADVIL,MOTRIN) 800 MG tablet Take 1 tablet (800 mg total) by mouth every 8 (eight) hours as needed for moderate pain. 08/19/15   Joni Reiningonald K Sidonia Nutter, PA-C    levETIRAcetam (KEPPRA) 750 MG tablet Take 1 tablet (750 mg total) by mouth 2 (two) times daily. Patient not taking: Reported on 08/16/2015 04/11/15   Shaune PollackQing Chen, MD  meloxicam (MOBIC) 15 MG tablet Take 1 tablet (15 mg total) by mouth daily. 03/13/16 03/13/17  Renford DillsLindsey Miller, NP  methocarbamol (ROBAXIN-750) 750 MG tablet Take 2 tablets (1,500 mg total) by mouth 4 (four) times daily. 08/19/15   Joni Reiningonald K Jashon Ishida, PA-C  oxyCODONE-acetaminophen (PERCOCET) 7.5-325 MG tablet Take 1 tablet by mouth every 6 (six) hours as needed for severe pain. 08/19/15   Joni Reiningonald K Zipporah Finamore, PA-C  phenazopyridine (PYRIDIUM) 200 MG tablet Take 1 tablet (200 mg total) by mouth 3 (three) times daily as needed for pain. 10/28/15 10/27/16  Chinita Pesterari B Triplett, FNP  sulfamethoxazole-trimethoprim (BACTRIM DS,SEPTRA DS) 800-160 MG tablet Take 1 tablet by mouth 2 (two) times daily. 03/13/16   Renford DillsLindsey Miller, NP    Allergies Tramadol  Family History  Problem Relation Age of Onset  . Seizures Mother     Social History Social History  Substance Use Topics  . Smoking status: Current Every Day Smoker  . Smokeless tobacco: Not on file  . Alcohol use No    Review of Systems Constitutional: No fever/chills Eyes: No visual changes. ENT: No sore throat. Cardiovascular: Denies chest pain. Respiratory: Denies shortness of breath. Gastrointestinal: No abdominal pain.  No nausea, no vomiting.  No diarrhea.  No constipation. Genitourinary: Negative for dysuria. Musculoskeletal: Negative for back pain. Skin: Negative for rash.  Superficial laceration to left forearm. Neurological: Negative for headaches, focal weakness or numbness.    ____________________________________________   PHYSICAL EXAM:  VITAL SIGNS: ED Triage Vitals [06/29/16 1046]  Enc Vitals Group     BP      Pulse      Resp      Temp      Temp src      SpO2      Weight 180 lb (81.6 kg)     Height      Head Circumference      Peak Flow      Pain Score 8     Pain Loc       Pain Edu?      Excl. in GC?     Constitutional: Alert and oriented. Well appearing and in no acute distress. Eyes: Conjunctivae are normal. PERRL. EOMI. Head: Atraumatic. Nose: No congestion/rhinnorhea. Mouth/Throat: Mucous membranes are moist.  Oropharynx non-erythematous. Neck: No stridor. No cervical spine tenderness to palpation. Hematological/Lymphatic/Immunilogical: No cervical lymphadenopathy. Cardiovascular: Normal rate, regular rhythm. Grossly normal heart sounds.  Good peripheral circulation. Respiratory: Normal respiratory effort.  No retractions. Lungs CTAB. Gastrointestinal: Soft and nontender. No distention. No abdominal bruits. No CVA tenderness. Musculoskeletal: No lower extremity tenderness nor edema.  No joint effusions. Neurologic:  Normal speech and language. No gross focal neurologic deficits are appreciated. No gait instability. Skin:  Skin is warm, dry and intact. No rash noted. Psychiatric: Mood and affect are normal. Speech and behavior are normal.  ____________________________________________   LABS (all labs ordered are listed, but only abnormal results are displayed)  Labs Reviewed - No data to display ____________________________________________  EKG   ____________________________________________  RADIOLOGY   ____________________________________________   PROCEDURES  Procedure(s) performed: LACERATION REPAIR Performed by: Joni Reiningonald K Afnan Emberton Authorized by: Joni Reiningonald K Gemini Bunte Consent: Verbal consent obtained. Risks and benefits: risks, benefits and alternatives were discussed Consent given by: patient Patient identity confirmed: provided demographic data Prepped and Draped in normal sterile fashion Wound explored  Laceration Location: Left forearm  Laceration Length: 3 cm  No Foreign Bodies seen or palpated  Anesthesia: local infiltration Local anesthetic:none Irrigation method: syringe Amount of cleaning: standard Skin closure:  Dermabond  Patient tolerance: Patient tolerated the procedure well with no immediate complications.   Procedures  Critical Care performed: No  ____________________________________________   INITIAL IMPRESSION / ASSESSMENT AND PLAN / ED COURSE  Pertinent labs & imaging results that were available during my care of the patient were reviewed by me and considered in my medical decision making (see chart for details).  Superficial laceration left forearm. Patient given discharge care instructions. Patient advised over-the-counter Tylenol ibuprofen for minor pain.  Clinical Course    Patient given tetanus shot in the clinic.  ____________________________________________   FINAL CLINICAL IMPRESSION(S) / ED DIAGNOSES  Final diagnoses:  Laceration of left forearm, initial encounter      NEW MEDICATIONS STARTED DURING THIS VISIT:  New Prescriptions   No medications on file     Note:  This document was prepared using Dragon voice recognition software and may include unintentional dictation errors.    Joni ReiningRonald K Dakisha Schoof, PA-C 06/29/16 1218    Rockne MenghiniAnne-Caroline Norman, MD 06/29/16 [phone]

## 2016-06-29 NOTE — ED Notes (Signed)
States she was moving some metal yesterday  Has several superficial lacerations to both arms and hands

## 2016-10-27 ENCOUNTER — Encounter: Payer: Self-pay | Admitting: Emergency Medicine

## 2016-10-27 DIAGNOSIS — Z5321 Procedure and treatment not carried out due to patient leaving prior to being seen by health care provider: Secondary | ICD-10-CM | POA: Diagnosis not present

## 2016-10-27 DIAGNOSIS — R1031 Right lower quadrant pain: Secondary | ICD-10-CM | POA: Diagnosis present

## 2016-10-27 DIAGNOSIS — F17200 Nicotine dependence, unspecified, uncomplicated: Secondary | ICD-10-CM | POA: Diagnosis not present

## 2016-10-27 DIAGNOSIS — I10 Essential (primary) hypertension: Secondary | ICD-10-CM | POA: Insufficient documentation

## 2016-10-27 LAB — COMPREHENSIVE METABOLIC PANEL
ALT: 15 U/L (ref 14–54)
AST: 16 U/L (ref 15–41)
Albumin: 4.3 g/dL (ref 3.5–5.0)
Alkaline Phosphatase: 70 U/L (ref 38–126)
Anion gap: 5 (ref 5–15)
BUN: 15 mg/dL (ref 6–20)
CALCIUM: 9.1 mg/dL (ref 8.9–10.3)
CHLORIDE: 106 mmol/L (ref 101–111)
CO2: 25 mmol/L (ref 22–32)
Creatinine, Ser: 0.77 mg/dL (ref 0.44–1.00)
GFR calc Af Amer: >60 mL/min (ref >60)
Glucose, Bld: 86 mg/dL (ref 65–99)
Potassium: 3.5 mmol/L (ref 3.5–5.1)
SODIUM: 136 mmol/L (ref 135–145)
TOTAL PROTEIN: 7.1 g/dL (ref 6.5–8.1)
Total Bilirubin: 0.5 mg/dL (ref 0.3–1.2)

## 2016-10-27 LAB — URINALYSIS, COMPLETE (UACMP) WITH MICROSCOPIC
Bacteria, UA: NONE SEEN
Bilirubin Urine: NEGATIVE
Glucose, UA: NEGATIVE mg/dL
HGB URINE DIPSTICK: NEGATIVE
Ketones, ur: 5 mg/dL — AB
Leukocytes, UA: NEGATIVE
NITRITE: NEGATIVE
PH: 5 (ref 5.0–8.0)
Protein, ur: NEGATIVE mg/dL
SPECIFIC GRAVITY, URINE: 1.034 — AB (ref 1.005–1.030)

## 2016-10-27 LAB — CBC
HCT: 39.7 % (ref 35.0–47.0)
Hemoglobin: 13.8 g/dL (ref 12.0–16.0)
MCH: 32.1 pg (ref 26.0–34.0)
MCHC: 34.7 g/dL (ref 32.0–36.0)
MCV: 92.5 fL (ref 80.0–100.0)
PLATELETS: 239 10*3/uL (ref 150–440)
RBC: 4.29 MIL/uL (ref 3.80–5.20)
RDW: 13.1 % (ref 11.5–14.5)
WBC: 13.5 10*3/uL — AB (ref 3.6–11.0)

## 2016-10-27 LAB — LIPASE, BLOOD: LIPASE: 11 U/L (ref 11–51)

## 2016-10-27 LAB — POCT PREGNANCY, URINE: Preg Test, Ur: NEGATIVE

## 2016-10-27 NOTE — ED Triage Notes (Signed)
Patient ambulatory to triage with steady gait, without difficulty or distress noted; pt reports today having right and mid lower abd pain; took stool stoftener with results of watery stool x 4 then took immodium for relief

## 2016-10-28 ENCOUNTER — Emergency Department
Admission: EM | Admit: 2016-10-28 | Discharge: 2016-10-28 | Disposition: A | Discharge status: Home / Self Care | Payer: Medicaid Other | Attending: Emergency Medicine | Admitting: Emergency Medicine

## 2016-10-28 ENCOUNTER — Telehealth: Payer: Self-pay | Admitting: Emergency Medicine

## 2016-10-28 NOTE — Telephone Encounter (Signed)
Called patient due to lwot to inquire about condition and follow up plans. Both numbers do not work.

## 2016-10-29 ENCOUNTER — Emergency Department
Admission: EM | Admit: 2016-10-29 | Discharge: 2016-10-29 | Disposition: A | Discharge status: Home / Self Care | Payer: Medicaid Other | Attending: Emergency Medicine | Admitting: Emergency Medicine

## 2016-10-29 ENCOUNTER — Emergency Department: Payer: Medicaid Other

## 2016-10-29 DIAGNOSIS — I10 Essential (primary) hypertension: Secondary | ICD-10-CM | POA: Insufficient documentation

## 2016-10-29 DIAGNOSIS — K5900 Constipation, unspecified: Secondary | ICD-10-CM | POA: Diagnosis not present

## 2016-10-29 DIAGNOSIS — F17200 Nicotine dependence, unspecified, uncomplicated: Secondary | ICD-10-CM | POA: Insufficient documentation

## 2016-10-29 DIAGNOSIS — Z791 Long term (current) use of non-steroidal anti-inflammatories (NSAID): Secondary | ICD-10-CM | POA: Diagnosis not present

## 2016-10-29 DIAGNOSIS — R140 Abdominal distension (gaseous): Secondary | ICD-10-CM | POA: Diagnosis present

## 2016-10-29 DIAGNOSIS — J45909 Unspecified asthma, uncomplicated: Secondary | ICD-10-CM | POA: Diagnosis not present

## 2016-10-29 LAB — URINALYSIS, COMPLETE (UACMP) WITH MICROSCOPIC
Bacteria, UA: NONE SEEN
Bilirubin Urine: NEGATIVE
Glucose, UA: NEGATIVE mg/dL
Hgb urine dipstick: NEGATIVE
KETONES UR: NEGATIVE mg/dL
Leukocytes, UA: NEGATIVE
Nitrite: NEGATIVE
PROTEIN: NEGATIVE mg/dL
Specific Gravity, Urine: 1.027 (ref 1.005–1.030)
pH: 7 (ref 5.0–8.0)

## 2016-10-29 LAB — COMPREHENSIVE METABOLIC PANEL
ALT: 14 U/L (ref 14–54)
AST: 18 U/L (ref 15–41)
Albumin: 4 g/dL (ref 3.5–5.0)
Alkaline Phosphatase: 66 U/L (ref 38–126)
Anion gap: 6 (ref 5–15)
BUN: 12 mg/dL (ref 6–20)
CHLORIDE: 106 mmol/L (ref 101–111)
CO2: 25 mmol/L (ref 22–32)
Calcium: 9.1 mg/dL (ref 8.9–10.3)
Creatinine, Ser: 0.78 mg/dL (ref 0.44–1.00)
Glucose, Bld: 118 mg/dL — ABNORMAL HIGH (ref 65–99)
POTASSIUM: 3.8 mmol/L (ref 3.5–5.1)
SODIUM: 137 mmol/L (ref 135–145)
Total Bilirubin: 0.6 mg/dL (ref 0.3–1.2)
Total Protein: 7 g/dL (ref 6.5–8.1)

## 2016-10-29 LAB — CBC
HEMATOCRIT: 39.8 % (ref 35.0–47.0)
Hemoglobin: 13.7 g/dL (ref 12.0–16.0)
MCH: 32 pg (ref 26.0–34.0)
MCHC: 34.5 g/dL (ref 32.0–36.0)
MCV: 92.7 fL (ref 80.0–100.0)
Platelets: 238 10*3/uL (ref 150–440)
RBC: 4.3 MIL/uL (ref 3.80–5.20)
RDW: 12.9 % (ref 11.5–14.5)
WBC: 10 10*3/uL (ref 3.6–11.0)

## 2016-10-29 LAB — LIPASE, BLOOD: LIPASE: 11 U/L (ref 11–51)

## 2016-10-29 LAB — PREGNANCY, URINE: Preg Test, Ur: NEGATIVE

## 2016-10-29 MED ORDER — POLYETHYLENE GLYCOL 3350 17 G PO PACK: 17.0000 g | PACK | Freq: Every day | ORAL | 0 refills | Status: DC

## 2016-10-29 MED ORDER — DICYCLOMINE HCL 20 MG PO TABS
20.0000 mg | ORAL_TABLET | Freq: Once | ORAL | Status: DC
  Filled 2016-10-29: qty 1

## 2016-10-29 MED ORDER — DICYCLOMINE HCL 20 MG PO TABS: 20.0000 mg | ORAL_TABLET | Freq: Three times a day (TID) | ORAL | 0 refills | Status: DC | PRN

## 2016-10-29 MED ORDER — DICYCLOMINE HCL 10 MG PO CAPS
ORAL_CAPSULE | ORAL | Status: AC
  Administered 2016-10-29: 20 mg
  Filled 2016-10-29: qty 2

## 2016-10-29 NOTE — ED Provider Notes (Signed)
[facility] Emergency Department Provider Note       Time seen: ----------------------------------------- 4:20 PM on 10/29/2016 -----------------------------------------     I have reviewed the triage vital signs and the nursing notes.   HISTORY   Chief Complaint Abdominal Pain    HPI Elizabeth Shea is a 26 y.o. female who presents to the ED for abdominal bloating with loose stools. Patient states she had diarrhea up until Monday but has not had any bowel movement since today which is Thursday. She denies fevers or chills, denies chest pain or difficulty breathing. Patient states she was having diarrhea so she took Imodium and has not passed her bowel since.   Past Medical History:  Diagnosis Date  . Asthma   . Hypertension     Patient Active Problem List   Diagnosis Date Noted  . Opiate abuse, episodic 04/10/2015  . Cannabis abuse 04/10/2015  . Acute encephalopathy 04/09/2015    Past Surgical History:  Procedure Laterality Date  . CESAREAN SECTION    . CHOLECYSTECTOMY      Allergies Tramadol  Social History Social History  Substance Use Topics  . Smoking status: Current Every Day Smoker  . Smokeless tobacco: Never Used  . Alcohol use No    Review of Systems Constitutional: Negative for fever. Cardiovascular: Negative for chest pain. Respiratory: Negative for shortness of breath. Gastrointestinal: Positive for abdominal pain, bloating Genitourinary: Negative for dysuria. Musculoskeletal: Negative for back pain. Skin: Negative for rash. Neurological: Negative for headaches, focal weakness or numbness.  10-point ROS otherwise negative.  ____________________________________________   PHYSICAL EXAM:  VITAL SIGNS: ED Triage Vitals  Enc Vitals Group     BP 10/29/16 1448 118/89     Pulse Rate 10/29/16 1448 83     Resp 10/29/16 1448 18     Temp 10/29/16 1448 98.2 F (36.8 C)     Temp src --      SpO2 10/29/16 1448 100 %      Weight 10/29/16 1449 175 lb (79.4 kg)     Height 10/29/16 1449  (1.6 m)     Head Circumference --      Peak Flow --      Pain Score --      Pain Loc --      Pain Edu? --      Excl. in GC? --     Constitutional: Alert and oriented. Well appearing and in no distress. Eyes: Conjunctivae are normal. PERRL. Normal extraocular movements. ENT   Head: Normocephalic and atraumatic.   Nose: No congestion/rhinnorhea.   Mouth/Throat: Mucous membranes are moist.   Neck: No stridor. Cardiovascular: Normal rate, regular rhythm. No murmurs, rubs, or gallops. Respiratory: Normal respiratory effort without tachypnea nor retractions. Breath sounds are clear and equal bilaterally. No wheezes/rales/rhonchi. Gastrointestinal: Nonfocal abdominal tenderness, no rebound or guarding. Normal bowel sounds. Musculoskeletal: Nontender with normal range of motion in extremities. No lower extremity tenderness nor edema. Neurologic:  Normal speech and language. No gross focal neurologic deficits are appreciated.  Skin:  Skin is warm, dry and intact. No rash noted. Psychiatric: Mood and affect are normal. Speech and behavior are normal.   ____________________________________________  ED COURSE:  Pertinent labs & imaging results that were available during my care of the patient were reviewed by me and considered in my medical decision making (see chart for details). Patient presents for abdominal pain, we will assess with labs and imaging as indicated.   Procedures ____________________________________________   LABS (  pertinent positives/negatives)  Labs Reviewed  COMPREHENSIVE METABOLIC PANEL - Abnormal; Notable for the following:       Result Value   Glucose, Bld 118 (*)    All other components within normal limits  URINALYSIS, COMPLETE (UACMP) WITH MICROSCOPIC - Abnormal; Notable for the following:    Color, Urine YELLOW (*)    APPearance HAZY (*)    Squamous Epithelial / LPF 6-30 (*)     All other components within normal limits  LIPASE, BLOOD  CBC  PREGNANCY, URINE    RADIOLOGY Images were viewed by me  Abdomen 2 view IMPRESSION: Normal bowel gas pattern. No acute findings. ____________________________________________  FINAL ASSESSMENT AND PLAN  Constipation  Plan: Patient's labs and imaging were dictated above. Patient had presented for abdominal pain, findings are suggestive of constipation. She'll be discharged with Bentyl and MiraLAX. She stable for outpatient follow-up.   Emily Filbert, MD   Note: This note was generated in part or whole with voice recognition software. Voice recognition is usually quite accurate but there are transcription errors that can and very often do occur. I apologize for any typographical errors that were not detected and corrected.     Emily Filbert, MD 10/29/16 [phone]

## 2016-10-29 NOTE — ED Notes (Signed)
Lab called and Urine Pregnancy added on to urine already in lab

## 2016-10-29 NOTE — ED Triage Notes (Signed)
Pt states came in 2 days for same but left after blood drawn. Still having abd bloating, loose stools

## 2017-02-17 DIAGNOSIS — F319 Bipolar disorder, unspecified: Secondary | ICD-10-CM | POA: Insufficient documentation

## 2017-02-17 DIAGNOSIS — F909 Attention-deficit hyperactivity disorder, unspecified type: Secondary | ICD-10-CM | POA: Insufficient documentation

## 2017-06-24 ENCOUNTER — Other Ambulatory Visit: Payer: Self-pay

## 2017-06-24 ENCOUNTER — Emergency Department
Admission: EM | Admit: 2017-06-24 | Discharge: 2017-06-24 | Disposition: A | Discharge status: Home / Self Care | Payer: Medicaid Other | Attending: Emergency Medicine | Admitting: Emergency Medicine

## 2017-06-24 DIAGNOSIS — Z79899 Other long term (current) drug therapy: Secondary | ICD-10-CM | POA: Insufficient documentation

## 2017-06-24 DIAGNOSIS — L299 Pruritus, unspecified: Secondary | ICD-10-CM | POA: Diagnosis not present

## 2017-06-24 DIAGNOSIS — F17200 Nicotine dependence, unspecified, uncomplicated: Secondary | ICD-10-CM | POA: Insufficient documentation

## 2017-06-24 DIAGNOSIS — J45909 Unspecified asthma, uncomplicated: Secondary | ICD-10-CM | POA: Insufficient documentation

## 2017-06-24 DIAGNOSIS — I10 Essential (primary) hypertension: Secondary | ICD-10-CM | POA: Diagnosis not present

## 2017-06-24 DIAGNOSIS — R21 Rash and other nonspecific skin eruption: Secondary | ICD-10-CM | POA: Insufficient documentation

## 2017-06-24 MED ORDER — METHYLPREDNISOLONE 4 MG PO TBPK: ORAL_TABLET | ORAL | 0 refills | Status: DC

## 2017-06-24 MED ORDER — HYDROXYZINE HCL 50 MG PO TABS
50.0000 mg | ORAL_TABLET | Freq: Once | ORAL | Status: AC
  Administered 2017-06-24: 50 mg via ORAL
  Filled 2017-06-24: qty 1

## 2017-06-24 MED ORDER — HYDROXYZINE HCL 50 MG PO TABS: 50.0000 mg | ORAL_TABLET | Freq: Three times a day (TID) | ORAL | 0 refills | Status: DC | PRN

## 2017-06-24 MED ORDER — METHYLPREDNISOLONE SODIUM SUCC 125 MG IJ SOLR
125.0000 mg | Freq: Once | INTRAMUSCULAR | Status: AC
Start: 2017-06-24 — End: 2017-06-24
  Administered 2017-06-24: 125 mg via INTRAMUSCULAR
  Filled 2017-06-24: qty 2

## 2017-06-24 NOTE — ED Notes (Signed)
See triage note  States she developed a rash 2 days ago  Red areas noted to upper chest and lateral rib areas

## 2017-06-24 NOTE — ED Triage Notes (Signed)
Pt comes into the ED via EMS from home c/o a raw itchy rash all over, states she just got a pig and changed dish detergent.

## 2017-06-24 NOTE — ED Provider Notes (Signed)
[facility] Emergency Department Provider Note   ____________________________________________   First MD Initiated Contact with Patient 06/24/17 1009     (approximate)  I have reviewed the triage vital signs and the nursing notes.   HISTORY  Chief Complaint Rash    HPI [name] is a 26 y.o. female patient arrived via EMS complaining of diffuse rash for 2 days. Patient state recently she obtain a pet pig and change dish detergent. Patient state rash is itchy. No palliative measures for complaint.   Past Medical History:  Diagnosis Date  . Asthma   . Hypertension     Patient Active Problem List   Diagnosis Date Noted  . Opiate abuse, episodic (HCC) 04/10/2015  . Cannabis abuse 04/10/2015  . Acute encephalopathy 04/09/2015    Past Surgical History:  Procedure Laterality Date  . CESAREAN SECTION    . CHOLECYSTECTOMY      Prior to Admission medications   Medication Sig Start Date End Date Taking? Authorizing Provider  acetaminophen (TYLENOL) 325 MG tablet Take 650 mg by mouth every 6 (six) hours as needed for mild pain or headache.    [provider]  dicyclomine (BENTYL) 20 MG tablet Take 1 tablet (20 mg total) by mouth 3 (three) times daily as needed for spasms. 10/29/16   Emily FilbertWilliams, Jonathan E, MD  gabapentin (NEURONTIN) 300 MG capsule Take 300 mg by mouth 2 (two) times daily.    [provider]  HYDROcodone-acetaminophen (NORCO/VICODIN) 5-325 MG tablet Take 1 tablet by mouth every 4 (four) hours as needed for moderate pain. Patient not taking: Reported on 08/16/2015 07/10/15   Tommi RumpsSummers, Rhonda L, PA-C  hydrOXYzine (ATARAX/VISTARIL) 50 MG tablet Take 1 tablet (50 mg total) by mouth 3 (three) times daily as needed. 06/24/17   Joni ReiningSmith, Quinntin Malter K, PA-C  ibuprofen (ADVIL,MOTRIN) 800 MG tablet Take 1 tablet (800 mg total) by mouth every 8 (eight) hours as needed for moderate pain. 08/19/15   Joni ReiningSmith, Atreyu Mak K, PA-C  levETIRAcetam (KEPPRA)  750 MG tablet Take 1 tablet (750 mg total) by mouth 2 (two) times daily. Patient not taking: Reported on 08/16/2015 04/11/15   Shaune Pollackhen, Qing, MD  methocarbamol (ROBAXIN-750) 750 MG tablet Take 2 tablets (1,500 mg total) by mouth 4 (four) times daily. 08/19/15   Joni ReiningSmith, Davis Vannatter K, PA-C  methylPREDNISolone (MEDROL DOSEPAK) 4 MG TBPK tablet Take Tapered dose as directed 06/24/17   Joni ReiningSmith, Eliseo Withers K, PA-C  oxyCODONE-acetaminophen (PERCOCET) 7.5-325 MG tablet Take 1 tablet by mouth every 6 (six) hours as needed for severe pain. 08/19/15   Joni ReiningSmith, Jahshua Bonito K, PA-C  polyethylene glycol [facility](MIRALAX / GLYCOLAX) packet Take 17 g by mouth daily. 10/29/16   Emily FilbertWilliams, Jonathan E, MD  sulfamethoxazole-trimethoprim (BACTRIM DS,SEPTRA DS) 800-160 MG tablet Take 1 tablet by mouth 2 (two) times daily. 03/13/16   Renford DillsMiller, Lindsey, NP    Allergies Tramadol  Family History  Problem Relation Age of Onset  . Seizures Mother     Social History Social History   Tobacco Use  . Smoking status: Current Every Day Smoker  . Smokeless tobacco: Never Used  Substance Use Topics  . Alcohol use: No  . Drug use: No    Review of Systems  Constitutional: No fever/chills Eyes: No visual changes. ENT: No sore throat. Cardiovascular: Denies chest pain. Respiratory: Denies shortness of breath. Gastrointestinal: No abdominal pain.  No nausea, no vomiting.  No diarrhea.  No constipation. Genitourinary: Negative for dysuria. Musculoskeletal: Negative for back pain. Skin: Positive  for rash. Neurological: Negative for headaches, focal weakness or numbness. Endocrine:Hypertension Allergic/Immunilogical: Tramadol ____________________________________________   PHYSICAL EXAM:  VITAL SIGNS: ED Triage Vitals  Enc Vitals Group     BP 06/24/17 0917 120/85     Pulse Rate 06/24/17 0917 87     Resp 06/24/17 0917 16     Temp 06/24/17 0917 98 F (36.7 C)     Temp Source 06/24/17 0917 Oral     SpO2 06/24/17 0917 98 %     Weight 06/24/17  0918 180 lb (81.6 kg)     Height 06/24/17 0918 5\' 3"  (1.6 m)     Head Circumference --      Peak Flow --      Pain Score 06/24/17 0917 8     Pain Loc --      Pain Edu? --      Excl. in GC? --    Constitutional: Alert and oriented. Well appearing and in no acute distress. Neck: No stridor. Hematological/Lymphatic/Immunilogical: No cervical lymphadenopathy. Cardiovascular: Normal rate, regular rhythm. Grossly normal heart sounds.  Good peripheral circulation. Respiratory: Normal respiratory effort.  No retractions. Lungs CTAB. Neurologic:  Normal speech and language. No gross focal neurologic deficits are appreciated. No gait instability. Skin:  Skin is warm, dry and intact. Diffuse erythematous macular rash sparing the face. Psychiatric: Mood and affect are normal. Speech and behavior are normal.  ____________________________________________   LABS (all labs ordered are listed, but only abnormal results are displayed)  Labs Reviewed - No data to display ____________________________________________  EKG   ____________________________________________  RADIOLOGY  No results found.  ____________________________________________   PROCEDURES  Procedure(s) performed: None  Procedures  Critical Care performed: No  ____________________________________________   INITIAL IMPRESSION / ASSESSMENT AND PLAN / ED COURSE  As part of my medical decision making, I reviewed the following data within the electronic MEDICAL RECORD NUMBER    Contact dermatitis. Patient given discharge care instructions. Patient advised take medication as directed and follow with [facility] condition persists.      ____________________________________________   FINAL CLINICAL IMPRESSION(S) / ED DIAGNOSES  Final diagnoses:  Rash and nonspecific skin eruption     ED Discharge Orders        Ordered    hydrOXYzine (ATARAX/VISTARIL) 50 MG tablet  3 times daily PRN     06/24/17 1023     methylPREDNISolone (MEDROL DOSEPAK) 4 MG TBPK tablet     06/24/17 1023       Note:  This document was prepared using Dragon voice recognition software and may include unintentional dictation errors.    Joni ReiningSmith, Jamiah Recore K, PA-C 06/24/17 1023    Jene EveryKinner, Robert, MD 06/24/17 1310

## 2017-09-16 ENCOUNTER — Emergency Department
Admission: EM | Admit: 2017-09-16 | Discharge: 2017-09-16 | Disposition: A | Discharge status: Home / Self Care | Payer: Medicaid Other | Attending: Physician Assistant | Admitting: Physician Assistant

## 2017-09-16 ENCOUNTER — Other Ambulatory Visit: Payer: Self-pay

## 2017-09-16 ENCOUNTER — Encounter: Payer: Self-pay | Admitting: Emergency Medicine

## 2017-09-16 DIAGNOSIS — J45909 Unspecified asthma, uncomplicated: Secondary | ICD-10-CM | POA: Insufficient documentation

## 2017-09-16 DIAGNOSIS — F17200 Nicotine dependence, unspecified, uncomplicated: Secondary | ICD-10-CM | POA: Diagnosis not present

## 2017-09-16 DIAGNOSIS — I10 Essential (primary) hypertension: Secondary | ICD-10-CM | POA: Insufficient documentation

## 2017-09-16 DIAGNOSIS — B309 Viral conjunctivitis, unspecified: Secondary | ICD-10-CM | POA: Insufficient documentation

## 2017-09-16 DIAGNOSIS — H11432 Conjunctival hyperemia, left eye: Secondary | ICD-10-CM | POA: Diagnosis present

## 2017-09-16 DIAGNOSIS — Z79899 Other long term (current) drug therapy: Secondary | ICD-10-CM | POA: Insufficient documentation

## 2017-09-16 MED ORDER — KETOROLAC TROMETHAMINE 0.5 % OP SOLN: 1.0000 [drp] | Freq: Four times a day (QID) | OPHTHALMIC | 0 refills | Status: AC

## 2017-09-16 NOTE — Discharge Instructions (Signed)
You have a case of viral conjunctivitis. Use the eye drops as directed. Follow-up with your provider as needed.

## 2017-09-16 NOTE — ED Triage Notes (Signed)
C/O right eye drainage and discomfort x 2 days.

## 2017-09-16 NOTE — ED Provider Notes (Signed)
[facility] Emergency Department Provider Note ____________________________________________  Time seen: 1012  I have reviewed the triage vital signs and the nursing notes.  HISTORY  Chief Complaint  Eye Problem  HPI Elizabeth Shea is a 27 y.o. female presents to the ED for evaluation of left eye irritation.  Patient describes 2 days of left eye redness, itching, irritation, and tearing.  She reports some purulence collecting in the inner canthus in the mornings.  She denies any cold he has, purulent drainage.  No nausea, vomiting, dizziness is appreciated patient does report a mild foreign body sensation but denies any local trauma, injury, or accident.  Past Medical History:  Diagnosis Date  . Asthma   . Hypertension     Patient Active Problem List   Diagnosis Date Noted  . Opiate abuse, episodic (HCC) 04/10/2015  . Cannabis abuse 04/10/2015  . Acute encephalopathy 04/09/2015    Past Surgical History:  Procedure Laterality Date  . CESAREAN SECTION    . CHOLECYSTECTOMY      Prior to Admission medications   Medication Sig Start Date End Date Taking? Authorizing Provider  gabapentin (NEURONTIN) 300 MG capsule Take 300 mg by mouth 2 (two) times daily.    [provider]  hydrOXYzine (ATARAX/VISTARIL) 50 MG tablet Take 1 tablet (50 mg total) by mouth 3 (three) times daily as needed. 06/24/17   Joni ReiningSmith, Ronald K, PA-C  ibuprofen (ADVIL,MOTRIN) 800 MG tablet Take 1 tablet (800 mg total) by mouth every 8 (eight) hours as needed for moderate pain. 08/19/15   Joni ReiningSmith, Ronald K, PA-C  ketorolac (ACULAR) 0.5 % ophthalmic solution Place 1 drop into the left eye 4 (four) times daily for 7 days. 09/16/17 09/23/17  Toleen Lachapelle, Charlesetta IvoryJenise V Bacon, PA-C  levETIRAcetam (KEPPRA) 750 MG tablet Take 1 tablet (750 mg total) by mouth 2 (two) times daily. Patient not taking: Reported on 08/16/2015 04/11/15   Shaune Pollackhen, Qing, MD  polyethylene glycol [facility](MIRALAX / Ethelene HalGLYCOLAX) packet Take 17 g by  mouth daily. 10/29/16   Emily FilbertWilliams, Jonathan E, MD    Allergies Tramadol  Family History  Problem Relation Age of Onset  . Seizures Mother     Social History Social History   Tobacco Use  . Smoking status: Current Every Day Smoker  . Smokeless tobacco: Never Used  Substance Use Topics  . Alcohol use: No  . Drug use: No    Review of Systems  Constitutional: Negative for fever. Eyes: Negative for visual changes. Reports itching, tearing, and redness ENT: Negative for sore throat. Cardiovascular: Negative for chest pain. Respiratory: Negative for shortness of breath. Gastrointestinal: Negative for abdominal pain, vomiting and diarrhea. Neurological: Negative for headaches, focal weakness or numbness. ____________________________________________  PHYSICAL EXAM:  VITAL SIGNS: ED Triage Vitals  Enc Vitals Group     BP 09/16/17 0902 124/81     Pulse Rate 09/16/17 0902 81     Resp 09/16/17 0902 16     Temp 09/16/17 0902 98.3 F (36.8 C)     Temp Source 09/16/17 0902 Oral     SpO2 09/16/17 0902 97 %     Weight 09/16/17 0900 170 lb (77.1 kg)     Height 09/16/17 0900 5\' 3"  (1.6 m)     Head Circumference --      Peak Flow --      Pain Score 09/16/17 0900 8     Pain Loc --      Pain Edu? --      Excl. in  GC? --     Constitutional: Alert and oriented. Well appearing and in no distress. Head: Normocephalic and atraumatic. Eyes: Conjunctivae are injected on the left. Mild periorbital erythema. No lid edema. Copious tearing noted. PERRL. Normal extraocular movements Hematological/Lymphatic/Immunological: Palpable left preauricular lymphadenopathy. Cardiovascular: Normal rate, regular rhythm. Normal distal pulses. Respiratory: Normal respiratory effort. No wheezes/rales/rhonchi. Gastrointestinal: Soft and nontender. No distention. Skin:  Skin is warm, dry and intact. No rash noted. ____________________________________________  PROCEDURES    Visual Acuity  Right Eye  Distance: 20/20 Left Eye Distance: 20/30 Bilateral Distance: 20 /20 _______________________________________________  INITIAL IMPRESSION / ASSESSMENT AND PLAN / ED COURSE  Patient with ED evaluation of left eye irritation, itching, tearing.  The patient clinical picture is consistent with an acute viral conjunctivitis.  She is discharged with a perception for Acular and given instruction on the self-limited course.  She is advised to use over-the-counter rewetting drops or artificial tears as needed.  She will follow-up with primary provider or return to the ED as needed. ____________________________________________  FINAL CLINICAL IMPRESSION(S) / ED DIAGNOSES  Final diagnoses:  Viral conjunctivitis, left eye      Karmen Stabs, Charlesetta Ivory, PA-C 09/16/17 1120    Governor Rooks, MD 09/16/17 [phone]

## 2017-09-16 NOTE — ED Notes (Signed)
See triage note   Presents with drainage and redness to left eye for the past 2 days

## 2017-11-28 ENCOUNTER — Emergency Department: Payer: Medicaid Other

## 2017-11-28 ENCOUNTER — Emergency Department
Admission: EM | Admit: 2017-11-28 | Discharge: 2017-11-28 | Disposition: A | Discharge status: Home / Self Care | Payer: Medicaid Other | Attending: Emergency Medicine | Admitting: Emergency Medicine

## 2017-11-28 ENCOUNTER — Other Ambulatory Visit: Payer: Self-pay

## 2017-11-28 DIAGNOSIS — X509XXA Other and unspecified overexertion or strenuous movements or postures, initial encounter: Secondary | ICD-10-CM | POA: Insufficient documentation

## 2017-11-28 DIAGNOSIS — S93602A Unspecified sprain of left foot, initial encounter: Secondary | ICD-10-CM | POA: Diagnosis not present

## 2017-11-28 DIAGNOSIS — I10 Essential (primary) hypertension: Secondary | ICD-10-CM | POA: Insufficient documentation

## 2017-11-28 DIAGNOSIS — F17210 Nicotine dependence, cigarettes, uncomplicated: Secondary | ICD-10-CM | POA: Diagnosis not present

## 2017-11-28 DIAGNOSIS — Y999 Unspecified external cause status: Secondary | ICD-10-CM | POA: Diagnosis not present

## 2017-11-28 DIAGNOSIS — J45909 Unspecified asthma, uncomplicated: Secondary | ICD-10-CM | POA: Diagnosis not present

## 2017-11-28 DIAGNOSIS — Z79899 Other long term (current) drug therapy: Secondary | ICD-10-CM | POA: Diagnosis not present

## 2017-11-28 DIAGNOSIS — Y92003 Bedroom of unspecified non-institutional (private) residence as the place of occurrence of the external cause: Secondary | ICD-10-CM | POA: Insufficient documentation

## 2017-11-28 DIAGNOSIS — Y9383 Activity, rough housing and horseplay: Secondary | ICD-10-CM | POA: Diagnosis not present

## 2017-11-28 DIAGNOSIS — S99922A Unspecified injury of left foot, initial encounter: Secondary | ICD-10-CM | POA: Diagnosis present

## 2017-11-28 MED ORDER — HYDROCODONE-ACETAMINOPHEN 5-325 MG PO TABS: 1.0000 | ORAL_TABLET | ORAL | 0 refills | Status: DC | PRN

## 2017-11-28 MED ORDER — NAPROXEN 500 MG PO TABS
500.0000 mg | ORAL_TABLET | Freq: Once | ORAL | Status: AC
  Administered 2017-11-28: 500 mg via ORAL
  Filled 2017-11-28: qty 1

## 2017-11-28 MED ORDER — HYDROCODONE-ACETAMINOPHEN 5-325 MG PO TABS
1.0000 | ORAL_TABLET | Freq: Once | ORAL | Status: AC
  Administered 2017-11-28: 1 via ORAL
  Filled 2017-11-28: qty 1

## 2017-11-28 MED ORDER — NAPROXEN 500 MG PO TABS: 500.0000 mg | ORAL_TABLET | Freq: Two times a day (BID) | ORAL | 0 refills | Status: DC

## 2017-11-28 NOTE — ED Provider Notes (Signed)
[facility] Emergency Department Provider Note ____________________________________________  Time seen: Approximately 8:03 PM  I have reviewed the triage vital signs and the nursing notes.   HISTORY  Chief Complaint Foot Pain    HPI Elizabeth Shea is a 27 y.o. female who presents to the emergency department for evaluation and treatment of left foot pain.  She states that she was roughhousing and got up out of bed and when she landed her toes turned under her foot.  She has severe pain to the left great toe.  She has taken Tylenol and ibuprofen without relief.  Past Medical History:  Diagnosis Date  . Asthma   . Hypertension     Patient Active Problem List   Diagnosis Date Noted  . Opiate abuse, episodic (HCC) 04/10/2015  . Cannabis abuse 04/10/2015  . Acute encephalopathy 04/09/2015    Past Surgical History:  Procedure Laterality Date  . CESAREAN SECTION    . CHOLECYSTECTOMY      Prior to Admission medications   Medication Sig Start Date End Date Taking? Authorizing Provider  gabapentin (NEURONTIN) 300 MG capsule Take 300 mg by mouth 2 (two) times daily.    [provider]  HYDROcodone-acetaminophen (NORCO/VICODIN) 5-325 MG tablet Take 1 tablet by mouth every 4 (four) hours as needed for moderate pain. 11/28/17 11/28/18  Anahla Bevis, Rulon Eisenmenger B, FNP  hydrOXYzine (ATARAX/VISTARIL) 50 MG tablet Take 1 tablet (50 mg total) by mouth 3 (three) times daily as needed. 06/24/17   Joni Reining, PA-C  ibuprofen (ADVIL,MOTRIN) 800 MG tablet Take 1 tablet (800 mg total) by mouth every 8 (eight) hours as needed for moderate pain. 08/19/15   Joni Reining, PA-C  levETIRAcetam (KEPPRA) 750 MG tablet Take 1 tablet (750 mg total) by mouth 2 (two) times daily. Patient not taking: Reported on 08/16/2015 04/11/15   Shaune Pollack, MD  naproxen (NAPROSYN) 500 MG tablet Take 1 tablet (500 mg total) by mouth 2 (two) times daily with a meal. 11/28/17   Jony Ladnier B, FNP   polyethylene glycol (MIRALAX / GLYCOLAX) packet Take 17 g by mouth daily. 10/29/16   Emily Filbert, MD    Allergies Tramadol  Family History  Problem Relation Age of Onset  . Seizures Mother     Social History Social History   Tobacco Use  . Smoking status: Current Every Day Smoker  . Smokeless tobacco: Never Used  Substance Use Topics  . Alcohol use: No  . Drug use: No    Review of Systems Constitutional: Negative for fever. Cardiovascular: Negative for chest pain. Respiratory: Negative for shortness of breath. Musculoskeletal: Positive for left foot pain. Skin: Negative for open wound or lesion Neurological: Negative for decrease in sensation  ____________________________________________   PHYSICAL EXAM:  VITAL SIGNS: ED Triage Vitals  Enc Vitals Group     BP 11/28/17 1946 121/85     Pulse Rate 11/28/17 1946 83     Resp 11/28/17 1946 20     Temp 11/28/17 1946 97.8 F (36.6 C)     Temp Source 11/28/17 1946 Oral     SpO2 11/28/17 1946 98 %     Weight 11/28/17 1947 190 lb (86.2 kg)     Height 11/28/17 1947  (1.6 m)     Head Circumference --      Peak Flow --      Pain Score 11/28/17 1947 8     Pain Loc --      Pain Edu? --  Excl. in GC? --     Constitutional: Alert and oriented. Well appearing and in no acute distress. Eyes: Conjunctivae are clear without discharge or drainage Head: Atraumatic Neck: Atraumatic Respiratory: No cough. Respirations are even and unlabored. Musculoskeletal: Pain with flexion or extension of the left great toe.  No deformity. Neurologic: Neurovascularly intact Skin: Contusion noted over the dorsal aspect of the left great toe.  Abrasion noted to the left knee. Psychiatric: Tearful.  ____________________________________________   LABS (all labs ordered are listed, but only abnormal results are displayed)  Labs Reviewed - No data to display ____________________________________________  RADIOLOGY  Image  of the left foot negative for acute bony abnormality per radiology. ____________________________________________   PROCEDURES  Procedures  ____________________________________________   INITIAL IMPRESSION / ASSESSMENT AND PLAN / ED COURSE  Elizabeth Shea is a 27 y.o. who presents to the emergency department for treatment and evaluation of left foot pain after injuring the toes when getting off the bed.  Ace wrap and postop shoe was applied. Patient instructed to follow-up with podiatry if not improving over the week.Marland Kitchen  She was also instructed to return to the emergency department for symptoms that change or worsen if unable schedule an appointment with orthopedics or primary care.  Medications  HYDROcodone-acetaminophen (NORCO/VICODIN) 5-325 MG per tablet 1 tablet (1 tablet Oral Given 11/28/17 2146)  naproxen (NAPROSYN) tablet 500 mg (500 mg Oral Given 11/28/17 2146)    Pertinent labs & imaging results that were available during my care of the patient were reviewed by me and considered in my medical decision making (see chart for details).  _________________________________________   FINAL CLINICAL IMPRESSION(S) / ED DIAGNOSES  Final diagnoses:  Sprain of left foot, initial encounter    ED Discharge Orders        Ordered    HYDROcodone-acetaminophen (NORCO/VICODIN) 5-325 MG tablet  Every 4 hours PRN     11/28/17 2124    naproxen (NAPROSYN) 500 MG tablet  2 times daily with meals     11/28/17 2124       If controlled substance prescribed during this visit, 12 month history viewed on the [facility] prior to issuing an initial prescription for Schedule II or III opiod.    Chinita Pester, FNP 11/28/17 2241    Emily Filbert, MD 11/28/17 [phone]

## 2017-11-28 NOTE — ED Triage Notes (Signed)
Reports playing around and getting up out of bed, when landed toes turned under her foot.  Pain to left foot.

## 2018-06-13 DIAGNOSIS — I10 Essential (primary) hypertension: Secondary | ICD-10-CM | POA: Insufficient documentation

## 2018-06-13 LAB — HM PAP SMEAR: HM Pap smear: NEGATIVE

## 2018-09-13 ENCOUNTER — Encounter: Payer: Self-pay | Admitting: Emergency Medicine

## 2018-09-13 ENCOUNTER — Emergency Department: Payer: Medicaid Other

## 2018-09-13 ENCOUNTER — Emergency Department
Admission: EM | Admit: 2018-09-13 | Discharge: 2018-09-13 | Disposition: A | Discharge status: Home / Self Care | Payer: Medicaid Other | Attending: Emergency Medicine | Admitting: Emergency Medicine

## 2018-09-13 ENCOUNTER — Other Ambulatory Visit: Payer: Self-pay

## 2018-09-13 DIAGNOSIS — O99331 Smoking (tobacco) complicating pregnancy, first trimester: Secondary | ICD-10-CM | POA: Diagnosis not present

## 2018-09-13 DIAGNOSIS — F1110 Opioid abuse, uncomplicated: Secondary | ICD-10-CM | POA: Diagnosis not present

## 2018-09-13 DIAGNOSIS — O99321 Drug use complicating pregnancy, first trimester: Secondary | ICD-10-CM | POA: Diagnosis not present

## 2018-09-13 DIAGNOSIS — Z3A08 8 weeks gestation of pregnancy: Secondary | ICD-10-CM | POA: Diagnosis not present

## 2018-09-13 DIAGNOSIS — J45909 Unspecified asthma, uncomplicated: Secondary | ICD-10-CM | POA: Diagnosis not present

## 2018-09-13 DIAGNOSIS — O10011 Pre-existing essential hypertension complicating pregnancy, first trimester: Secondary | ICD-10-CM | POA: Diagnosis not present

## 2018-09-13 DIAGNOSIS — O209 Hemorrhage in early pregnancy, unspecified: Secondary | ICD-10-CM | POA: Diagnosis not present

## 2018-09-13 DIAGNOSIS — Z9049 Acquired absence of other specified parts of digestive tract: Secondary | ICD-10-CM | POA: Diagnosis not present

## 2018-09-13 DIAGNOSIS — F17200 Nicotine dependence, unspecified, uncomplicated: Secondary | ICD-10-CM | POA: Diagnosis not present

## 2018-09-13 DIAGNOSIS — Z79899 Other long term (current) drug therapy: Secondary | ICD-10-CM | POA: Insufficient documentation

## 2018-09-13 DIAGNOSIS — F1210 Cannabis abuse, uncomplicated: Secondary | ICD-10-CM | POA: Diagnosis not present

## 2018-09-13 DIAGNOSIS — O99511 Diseases of the respiratory system complicating pregnancy, first trimester: Secondary | ICD-10-CM | POA: Insufficient documentation

## 2018-09-13 DIAGNOSIS — O4690 Antepartum hemorrhage, unspecified, unspecified trimester: Secondary | ICD-10-CM

## 2018-09-13 DIAGNOSIS — O219 Vomiting of pregnancy, unspecified: Secondary | ICD-10-CM | POA: Diagnosis present

## 2018-09-13 LAB — COMPREHENSIVE METABOLIC PANEL
ALBUMIN: 4 g/dL (ref 3.5–5.0)
ALK PHOS: 59 U/L (ref 38–126)
ALT: 18 U/L (ref 0–44)
ANION GAP: 5 (ref 5–15)
AST: 21 U/L (ref 15–41)
BILIRUBIN TOTAL: 0.5 mg/dL (ref 0.3–1.2)
BUN: 9 mg/dL (ref 6–20)
CALCIUM: 8.9 mg/dL (ref 8.9–10.3)
CO2: 21 mmol/L — AB (ref 22–32)
CREATININE: 0.56 mg/dL (ref 0.44–1.00)
Chloride: 107 mmol/L (ref 98–111)
GFR calc Af Amer: >60 mL/min (ref >60)
GFR calc non Af Amer: >60 mL/min (ref >60)
GLUCOSE: 123 mg/dL — AB (ref 70–99)
Potassium: 3.7 mmol/L (ref 3.5–5.1)
SODIUM: 133 mmol/L — AB (ref 135–145)
TOTAL PROTEIN: 7.1 g/dL (ref 6.5–8.1)

## 2018-09-13 LAB — HCG, QUANTITATIVE, PREGNANCY: HCG, BETA CHAIN, QUANT, S: 159110 m[IU]/mL — AB (ref <5)

## 2018-09-13 LAB — CBC
HEMATOCRIT: 42.4 % (ref 36.0–46.0)
HEMOGLOBIN: 14.5 g/dL (ref 12.0–15.0)
MCH: 30.7 pg (ref 26.0–34.0)
MCHC: 34.2 g/dL (ref 30.0–36.0)
MCV: 89.6 fL (ref 80.0–100.0)
NRBC: 0 % (ref 0.0–0.2)
Platelets: 242 10*3/uL (ref 150–400)
RBC: 4.73 MIL/uL (ref 3.87–5.11)
RDW: 12.4 % (ref 11.5–15.5)
WBC: 9.3 10*3/uL (ref 4.0–10.5)

## 2018-09-13 LAB — URINALYSIS, COMPLETE (UACMP) WITH MICROSCOPIC
BILIRUBIN URINE: NEGATIVE
GLUCOSE, UA: NEGATIVE mg/dL
HGB URINE DIPSTICK: NEGATIVE
Ketones, ur: NEGATIVE mg/dL
Nitrite: NEGATIVE
Protein, ur: 30 mg/dL — AB
SPECIFIC GRAVITY, URINE: 1.027 (ref 1.005–1.030)
pH: 6 (ref 5.0–8.0)

## 2018-09-13 LAB — ABO/RH: ABO/RH(D): O POS

## 2018-09-13 NOTE — ED Notes (Signed)
Family repeatedly coming out to desk. Informed that family can't be in [place] with pt while in ED. Husband annoyed at that response. Walked back to room.

## 2018-09-13 NOTE — ED Triage Notes (Signed)
Says she has been vomiting for 2 weeks.  Thinks she may be pregnant, but she says she would have to wait for achd appt and then they would charge her 10 dollars for the test.

## 2018-09-13 NOTE — ED Notes (Addendum)
This RN was in another pt room when pt left. Registration informed this RN that pt and family had left and that MD had instructed them on how to use mychart to access results. Unable to updated VS prior to leaving d/t pt leaving while staff was in another pt room.

## 2018-09-13 NOTE — ED Provider Notes (Signed)
[facility] Emergency Department Provider Note   ____________________________________________    I have reviewed the triage vital signs and the nursing notes.   HISTORY  Chief Complaint Emesis and Vaginal Bleeding     HPI Elizabeth Shea is a 28 y.o. female dense with complaints of nausea and vomiting over the last 2 weeks.  She thinks that she may be pregnant.  Last menstrual cycle was in mid December.  She did use a home pregnancy test which was faintly positive.  She does report small mount of blood after urinating today.  Occasional abdominal cramping, none currently.  No fevers or chills.  G4, P3  Past Medical History:  Diagnosis Date  . Asthma   . Hypertension     Patient Active Problem List   Diagnosis Date Noted  . Opiate abuse, episodic (HCC) 04/10/2015  . Cannabis abuse 04/10/2015  . Acute encephalopathy 04/09/2015    Past Surgical History:  Procedure Laterality Date  . CESAREAN SECTION    . CHOLECYSTECTOMY      Prior to Admission medications   Medication Sig Start Date End Date Taking? Authorizing Provider  gabapentin (NEURONTIN) 300 MG capsule Take 300 mg by mouth 2 (two) times daily.    [provider]  HYDROcodone-acetaminophen (NORCO/VICODIN) 5-325 MG tablet Take 1 tablet by mouth every 4 (four) hours as needed for moderate pain. 11/28/17 11/28/18  Triplett, Rulon Eisenmenger B, FNP  hydrOXYzine (ATARAX/VISTARIL) 50 MG tablet Take 1 tablet (50 mg total) by mouth 3 (three) times daily as needed. 06/24/17   Joni Reining, PA-C  ibuprofen (ADVIL,MOTRIN) 800 MG tablet Take 1 tablet (800 mg total) by mouth every 8 (eight) hours as needed for moderate pain. 08/19/15   Joni Reining, PA-C  levETIRAcetam (KEPPRA) 750 MG tablet Take 1 tablet (750 mg total) by mouth 2 (two) times daily. Patient not taking: Reported on 08/16/2015 04/11/15   Shaune Pollack, MD  naproxen (NAPROSYN) 500 MG tablet Take 1 tablet (500 mg total) by mouth 2 (two) times daily  with a meal. 11/28/17   Triplett, Cari B, FNP  polyethylene glycol (MIRALAX / GLYCOLAX) packet Take 17 g by mouth daily. 10/29/16   Emily Filbert, MD     Allergies Tramadol  Family History  Problem Relation Age of Onset  . Seizures Mother     Social History Social History   Tobacco Use  . Smoking status: Current Every Day Smoker  . Smokeless tobacco: Never Used  Substance Use Topics  . Alcohol use: No  . Drug use: No    Review of Systems  Constitutional: No fever/chills Eyes: No visual changes.  ENT: No sore throat. Cardiovascular: Denies chest pain. Respiratory: Denies shortness of breath. Gastrointestinal: As above Genitourinary: Negative for dysuria.  As above Musculoskeletal: Negative for back pain. Skin: Negative for rash. Neurological: Negative for headaches   ____________________________________________   PHYSICAL EXAM:  VITAL SIGNS: ED Triage Vitals  Enc Vitals Group     BP 09/13/18 1039 131/81     Pulse Rate 09/13/18 1039 83     Resp 09/13/18 1039 16     Temp 09/13/18 1039 97.8 F (36.6 C)     Temp Source 09/13/18 1039 Oral     SpO2 09/13/18 1039 98 %     Weight 09/13/18 1040 90.7 kg (200 lb)     Height 09/13/18 1040 1.6 m (5\' 3" )     Head Circumference --      Peak Flow --  Pain Score 09/13/18 1040 0     Pain Loc --      Pain Edu? --      Excl. in GC? --     Constitutional: Alert and oriented Eyes: Conjunctivae are normal.   Nose: No congestion/rhinnorhea. Mouth/Throat: Mucous membranes are moist.    Cardiovascular: Normal rate, regular rhythm. Grossly normal heart sounds.  Good peripheral circulation. Respiratory: Normal respiratory effort.  No retractions. Lungs CTAB. Gastrointestinal: Soft and nontender. No distention.  No CVA tenderness.  Musculoskeletal:   Warm and well perfused Neurologic:  Normal speech and language. No gross focal neurologic deficits are appreciated.  Skin:  Skin is warm, dry and intact. No rash  noted. Psychiatric: Mood and affect are normal. Speech and behavior are normal.  ____________________________________________   LABS (all labs ordered are listed, but only abnormal results are displayed)  Labs Reviewed  COMPREHENSIVE METABOLIC PANEL - Abnormal; Notable for the following components:      Result Value   Sodium 133 (*)    CO2 21 (*)    Glucose, Bld 123 (*)    All other components within normal limits  URINALYSIS, COMPLETE (UACMP) WITH MICROSCOPIC - Abnormal; Notable for the following components:   Color, Urine AMBER (*)    APPearance CLOUDY (*)    Protein, ur 30 (*)    Leukocytes,Ua TRACE (*)    Bacteria, UA RARE (*)    All other components within normal limits  HCG, QUANTITATIVE, PREGNANCY - Abnormal; Notable for the following components:   hCG, Beta Chain, Quant, S 159,110 (*)    All other components within normal limits  CBC  ABO/RH   ____________________________________________  EKG  None ____________________________________________  RADIOLOGY  Ultrasound ____________________________________________   PROCEDURES  Procedure(s) performed: No  Procedures   Critical Care performed: No ____________________________________________   INITIAL IMPRESSION / ASSESSMENT AND PLAN / ED COURSE  Pertinent labs & imaging results that were available during my care of the patient were reviewed by me and considered in my medical decision making (see chart for details).  Patient overall well-appearing and in no acute distress, hCG is positive at 159,000, pending ultrasound  ----------------------------------------- 2:17 PM on 09/13/2018 -----------------------------------------  Patient is O+  She reports she has to leave to pick up her kids, ultrasound has not resulted yet, she understands that she is leaving prior to work-up being completed and the diagnosis is very uncertain, she has decisional capacity.     ____________________________________________   FINAL CLINICAL IMPRESSION(S) / ED DIAGNOSES  Final diagnoses:  Vaginal bleeding in pregnancy        Note:  This document was prepared using Dragon voice recognition software and may include unintentional dictation errors.   Jene Every, MD 09/13/18 [phone]

## 2018-09-13 NOTE — ED Notes (Signed)
Pt stating she has to go pick up kids from school. States "there's no one there when they get home." pt mother and husband at bedside. MD informed. He stated pt could leave if needed. Pt stated she would wait until 2:20 at the latest to wait for [place] results.

## 2018-09-13 NOTE — ED Notes (Signed)
Pt taken to US via stretcher

## 2018-09-16 ENCOUNTER — Encounter: Payer: Self-pay | Admitting: Emergency Medicine

## 2018-09-16 ENCOUNTER — Other Ambulatory Visit: Payer: Self-pay

## 2018-09-16 ENCOUNTER — Emergency Department
Admission: EM | Admit: 2018-09-16 | Discharge: 2018-09-16 | Disposition: A | Discharge status: Home / Self Care | Payer: Medicaid Other | Attending: Emergency Medicine | Admitting: Emergency Medicine

## 2018-09-16 DIAGNOSIS — O9989 Other specified diseases and conditions complicating pregnancy, childbirth and the puerperium: Secondary | ICD-10-CM | POA: Diagnosis present

## 2018-09-16 DIAGNOSIS — O26851 Spotting complicating pregnancy, first trimester: Secondary | ICD-10-CM | POA: Diagnosis not present

## 2018-09-16 DIAGNOSIS — Z3A08 8 weeks gestation of pregnancy: Secondary | ICD-10-CM | POA: Diagnosis not present

## 2018-09-16 DIAGNOSIS — J45909 Unspecified asthma, uncomplicated: Secondary | ICD-10-CM | POA: Insufficient documentation

## 2018-09-16 DIAGNOSIS — Z79899 Other long term (current) drug therapy: Secondary | ICD-10-CM | POA: Insufficient documentation

## 2018-09-16 DIAGNOSIS — F17200 Nicotine dependence, unspecified, uncomplicated: Secondary | ICD-10-CM | POA: Insufficient documentation

## 2018-09-16 DIAGNOSIS — I10 Essential (primary) hypertension: Secondary | ICD-10-CM | POA: Diagnosis not present

## 2018-09-16 DIAGNOSIS — O4690 Antepartum hemorrhage, unspecified, unspecified trimester: Secondary | ICD-10-CM

## 2018-09-16 LAB — CBC WITH DIFFERENTIAL/PLATELET
ABS IMMATURE GRANULOCYTES: 0.03 10*3/uL (ref 0.00–0.07)
Basophils Absolute: 0 10*3/uL (ref 0.0–0.1)
Basophils Relative: 0 %
EOS PCT: 0 %
Eosinophils Absolute: 0 10*3/uL (ref 0.0–0.5)
HEMATOCRIT: 42.4 % (ref 36.0–46.0)
HEMOGLOBIN: 14.8 g/dL (ref 12.0–15.0)
Immature Granulocytes: 0 %
LYMPHS ABS: 1.8 10*3/uL (ref 0.7–4.0)
Lymphocytes Relative: 17 %
MCH: 31 pg (ref 26.0–34.0)
MCHC: 34.9 g/dL (ref 30.0–36.0)
MCV: 88.9 fL (ref 80.0–100.0)
MONO ABS: 0.5 10*3/uL (ref 0.1–1.0)
MONOS PCT: 5 %
NEUTROS ABS: 8.6 10*3/uL — AB (ref 1.7–7.7)
Neutrophils Relative %: 78 %
PLATELETS: 233 10*3/uL (ref 150–400)
RBC: 4.77 MIL/uL (ref 3.87–5.11)
RDW: 12.5 % (ref 11.5–15.5)
Smear Review: NORMAL
WBC: 11.1 10*3/uL — ABNORMAL HIGH (ref 4.0–10.5)
nRBC: 0 % (ref 0.0–0.2)

## 2018-09-16 LAB — URINALYSIS, COMPLETE (UACMP) WITH MICROSCOPIC
BACTERIA UA: NONE SEEN
Bilirubin Urine: NEGATIVE
GLUCOSE, UA: NEGATIVE mg/dL
KETONES UR: NEGATIVE mg/dL
LEUKOCYTE UA: NEGATIVE
NITRITE: NEGATIVE
PROTEIN: 30 mg/dL — AB
Specific Gravity, Urine: 1.024 (ref 1.005–1.030)
pH: 8 (ref 5.0–8.0)

## 2018-09-16 LAB — HCG, QUANTITATIVE, PREGNANCY: hCG, Beta Chain, Quant, S: 142748 m[IU]/mL — ABNORMAL HIGH (ref <5)

## 2018-09-16 MED ORDER — MICONAZOLE NITRATE 1200 & 2 MG & % VA KIT: 1.0000 | PACK | Freq: Once | VAGINAL | 0 refills | Status: AC

## 2018-09-16 NOTE — ED Notes (Signed)
NAD noted at time of D/C. Pt denies questions or concerns. Pt ambulatory to the lobby at this time.  

## 2018-09-16 NOTE — ED Triage Notes (Signed)
Patient reports she was seen on 2/18 for vaginal bleeding. Patient states she had stopped bleeding prior to that visit and had transvaginal ultrasound. States since having ultrasound, she has had continued vaginal bleeding and cramping.

## 2018-09-16 NOTE — ED Notes (Signed)
Pt presents to ED via POV with c/o continued vaginal bleeding. Pt states was seen on 2/18 and had US done, per [place] report pt is [redacted] weeks pregnant, G4. Pt c/o bright pink/bright red bleeding at this time with clots. Pt states soaking through 1pad/hour. Pt is alert and oriented x 4, NAD noted at this time.

## 2018-09-16 NOTE — ED Provider Notes (Signed)
[facility] Emergency Department Provider Note   ____________________________________________    I have reviewed the triage vital signs and the nursing notes.   HISTORY  Chief Complaint Vaginal Bleeding     HPI Elizabeth Shea is a 28 y.o. female who presents with complaints of vaginal bleeding, she reports she is approximately [redacted] weeks pregnant.  Patient seen by me 3 days ago had ultrasound but had to leave prior to results, results reviewed today 8-week IUP no issues at that time.  She reports continued mild bleeding.  No abdominal pain or cramping.  No fevers or chills.  Still has not seen OB.  Past Medical History:  Diagnosis Date  . Asthma   . Hypertension     Patient Active Problem List   Diagnosis Date Noted  . Opiate abuse, episodic ([place]) 04/10/2015  . Cannabis abuse 04/10/2015  . Acute encephalopathy 04/09/2015    Past Surgical History:  Procedure Laterality Date  . CESAREAN SECTION    . CHOLECYSTECTOMY      Prior to Admission medications   Medication Sig Start Date End Date Taking? Authorizing Provider  gabapentin (NEURONTIN) 300 MG capsule Take 300 mg by mouth 2 (two) times daily.    [provider]  HYDROcodone-acetaminophen (NORCO/VICODIN) 5-325 MG tablet Take 1 tablet by mouth every 4 (four) hours as needed for moderate pain. 11/28/17 11/28/18  Triplett, Johnette Abraham B, FNP  hydrOXYzine (ATARAX/VISTARIL) 50 MG tablet Take 1 tablet (50 mg total) by mouth 3 (three) times daily as needed. 06/24/17   Sable Feil, PA-C  ibuprofen (ADVIL,MOTRIN) 800 MG tablet Take 1 tablet (800 mg total) by mouth every 8 (eight) hours as needed for moderate pain. 08/19/15   Sable Feil, PA-C  levETIRAcetam (KEPPRA) 750 MG tablet Take 1 tablet (750 mg total) by mouth 2 (two) times daily. Patient not taking: Reported on 08/16/2015 04/11/15   Demetrios Loll, MD  miconazole (MONISTAT 1 COMBO PACK) kit Place 1 each vaginally once for 1 dose. 09/16/18 09/16/18   Lavonia Drafts, MD  naproxen (NAPROSYN) 500 MG tablet Take 1 tablet (500 mg total) by mouth 2 (two) times daily with a meal. 11/28/17   Triplett, Cari B, FNP  polyethylene glycol (MIRALAX / GLYCOLAX) packet Take 17 g by mouth daily. 10/29/16   Earleen Newport, MD     Allergies Tramadol  Family History  Problem Relation Age of Onset  . Seizures Mother     Social History Social History   Tobacco Use  . Smoking status: Current Every Day Smoker  . Smokeless tobacco: Never Used  Substance Use Topics  . Alcohol use: No  . Drug use: No    Review of Systems  Constitutional: No fever/chills Eyes: No visual changes.  ENT: No sore throat. Cardiovascular: Denies chest pain. Respiratory: Denies shortness of breath. Gastrointestinal: As above Genitourinary: Negative for dysuria. Musculoskeletal: Negative for back pain. Skin: Negative for rash. Neurological: Negative for headaches   ____________________________________________   PHYSICAL EXAM:  VITAL SIGNS: ED Triage Vitals  Enc Vitals Group     BP 09/16/18 1510 128/89     Pulse Rate 09/16/18 1510 83     Resp 09/16/18 1510 18     Temp 09/16/18 1510 97.9 F (36.6 C)     Temp Source 09/16/18 1510 Oral     SpO2 09/16/18 1510 97 %     Weight 09/16/18 1510 90.7 kg (200 lb)     Height 09/16/18 1510 1.6 m (5'  3")     Head Circumference --      Peak Flow --      Pain Score 09/16/18 1515 4     Pain Loc --      Pain Edu? --      Excl. in [place]? --     Constitutional: Alert and oriented.  Eyes: Conjunctivae are normal.   Nose: No congestion/rhinnorhea. Mouth/Throat: Mucous membranes are moist.    Cardiovascular:Kermit Balo peripheral circulation. Respiratory: Normal respiratory effort.  No retractions. L Gastrointestinal: Soft and nontender. No distention.   Genitourinary: deferred Musculoskeletal:   Warm and well perfused Neurologic:  Normal speech and language. No gross focal neurologic deficits are appreciated.  Skin:   Skin is warm, dry and intact. No rash noted. Psychiatric: Mood and affect are normal. Speech and behavior are normal.  ____________________________________________   LABS (all labs ordered are listed, but only abnormal results are displayed)  Labs Reviewed  HCG, QUANTITATIVE, PREGNANCY - Abnormal; Notable for the following components:      Result Value   hCG, Beta Chain, Quant, S 142,748 (*)    All other components within normal limits  URINALYSIS, COMPLETE (UACMP) WITH MICROSCOPIC - Abnormal; Notable for the following components:   Color, Urine AMBER (*)    APPearance CLOUDY (*)    Hgb urine dipstick MODERATE (*)    Protein, ur 30 (*)    All other components within normal limits  CBC WITH DIFFERENTIAL/PLATELET - Abnormal; Notable for the following components:   WBC 11.1 (*)    Neutro Abs 8.6 (*)    All other components within normal limits   ____________________________________________  EKG  None ____________________________________________  RADIOLOGY  Ultrasound reviewed from February 18 ____________________________________________   PROCEDURES  Procedure(s) performed: No  Procedures   Critical Care performed: No ____________________________________________   INITIAL IMPRESSION / ASSESSMENT AND PLAN / ED COURSE  Pertinent labs & imaging results that were available during my care of the patient were reviewed by me and considered in my medical decision making (see chart for details).  Patient presents with continued intermittent bleeding.  Recent ultrasound 3 days ago, she is greater than 8 weeks so beta has likely peaked, slight decrease today is unlikely to be  meaningful.  Will recommend follow-up with OB.  Yeast seen on urinalysis, she does report some discharge, although recently had exam by home department with negative testing.  Will treat with Diflucan    ____________________________________________   FINAL CLINICAL IMPRESSION(S) / ED  DIAGNOSES  Final diagnoses:  Vaginal bleeding in pregnancy        Note:  This document was prepared using Dragon voice recognition software and may include unintentional dictation errors.   Lavonia Drafts, MD 09/16/18 [phone]

## 2018-09-23 LAB — I-STAT BETA HCG BLOOD, ED (NOT ORDERABLE): I-stat hCG, quantitative: >2000 m[IU]/mL — ABNORMAL HIGH (ref <5)

## 2018-09-27 ENCOUNTER — Other Ambulatory Visit: Payer: Self-pay | Admitting: Advanced Practice Midwife

## 2018-09-27 DIAGNOSIS — Z369 Encounter for antenatal screening, unspecified: Secondary | ICD-10-CM

## 2018-09-27 DIAGNOSIS — E669 Obesity, unspecified: Secondary | ICD-10-CM | POA: Insufficient documentation

## 2018-09-27 DIAGNOSIS — O0990 Supervision of high risk pregnancy, unspecified, unspecified trimester: Secondary | ICD-10-CM | POA: Insufficient documentation

## 2018-09-27 LAB — OB RESULTS CONSOLE PLATELET COUNT: Platelets: 252

## 2018-09-27 LAB — OB RESULTS CONSOLE HGB/HCT, BLOOD
HCT: 42 — AB (ref 29–41)
Hemoglobin: 14.3

## 2018-09-28 ENCOUNTER — Other Ambulatory Visit: Payer: Self-pay | Admitting: Advanced Practice Midwife

## 2018-09-28 DIAGNOSIS — IMO0002 Reserved for concepts with insufficient information to code with codable children: Secondary | ICD-10-CM | POA: Insufficient documentation

## 2018-09-28 DIAGNOSIS — Z3481 Encounter for supervision of other normal pregnancy, first trimester: Secondary | ICD-10-CM

## 2018-09-28 DIAGNOSIS — Z915 Personal history of self-harm: Secondary | ICD-10-CM | POA: Insufficient documentation

## 2018-09-28 LAB — OB RESULTS CONSOLE ABO/RH: RH Type: POSITIVE

## 2018-09-28 LAB — OB RESULTS CONSOLE GC/CHLAMYDIA
Chlamydia: NEGATIVE
Gonorrhea: NEGATIVE

## 2018-09-28 LAB — OB RESULTS CONSOLE TSH: TSH: 1.54

## 2018-09-28 LAB — OB RESULTS CONSOLE ANTIBODY SCREEN: Antibody Screen: NEGATIVE

## 2018-09-28 LAB — OB RESULTS CONSOLE HEPATITIS B SURFACE ANTIGEN: Hepatitis B Surface Ag: NEGATIVE

## 2018-09-28 LAB — OB RESULTS CONSOLE HIV ANTIBODY (ROUTINE TESTING): HIV: NONREACTIVE

## 2018-09-29 LAB — OB RESULTS CONSOLE RPR: RPR: NONREACTIVE

## 2018-09-30 ENCOUNTER — Ambulatory Visit
Admission: RE | Admit: 2018-09-30 | Discharge: 2018-09-30 | Disposition: A | Discharge status: Home / Self Care | Payer: Medicaid Other | Source: Ambulatory Visit | Attending: Advanced Practice Midwife | Admitting: Advanced Practice Midwife

## 2018-09-30 DIAGNOSIS — Z3481 Encounter for supervision of other normal pregnancy, first trimester: Secondary | ICD-10-CM

## 2018-10-10 ENCOUNTER — Other Ambulatory Visit: Payer: Self-pay

## 2018-10-10 DIAGNOSIS — O0991 Supervision of high risk pregnancy, unspecified, first trimester: Secondary | ICD-10-CM

## 2018-10-13 ENCOUNTER — Ambulatory Visit
Admission: RE | Admit: 2018-10-13 | Discharge: 2018-10-13 | Disposition: A | Discharge status: Home / Self Care | Payer: Medicaid Other | Source: Ambulatory Visit

## 2018-10-13 ENCOUNTER — Ambulatory Visit (HOSPITAL_BASED_OUTPATIENT_CLINIC_OR_DEPARTMENT_OTHER)
Admission: RE | Admit: 2018-10-13 | Discharge: 2018-10-13 | Disposition: A | Discharge status: Home / Self Care | Payer: Medicaid Other | Source: Ambulatory Visit | Attending: Maternal & Fetal Medicine | Admitting: Maternal & Fetal Medicine

## 2018-10-13 ENCOUNTER — Other Ambulatory Visit: Payer: Self-pay

## 2018-10-13 ENCOUNTER — Ambulatory Visit
Admission: RE | Admit: 2018-10-13 | Discharge: 2018-10-13 | Disposition: A | Discharge status: Home / Self Care | Payer: Medicaid Other | Source: Ambulatory Visit | Attending: Maternal & Fetal Medicine | Admitting: Maternal & Fetal Medicine

## 2018-10-13 VITALS — BP 135/93 | HR 84 | Temp 98.0°F | Resp 20 | Wt 196.0 lb

## 2018-10-13 DIAGNOSIS — O99513 Diseases of the respiratory system complicating pregnancy, third trimester: Secondary | ICD-10-CM | POA: Insufficient documentation

## 2018-10-13 DIAGNOSIS — Z9049 Acquired absence of other specified parts of digestive tract: Secondary | ICD-10-CM | POA: Diagnosis not present

## 2018-10-13 DIAGNOSIS — J45909 Unspecified asthma, uncomplicated: Secondary | ICD-10-CM | POA: Insufficient documentation

## 2018-10-13 DIAGNOSIS — Z369 Encounter for antenatal screening, unspecified: Secondary | ICD-10-CM

## 2018-10-13 DIAGNOSIS — Z885 Allergy status to narcotic agent status: Secondary | ICD-10-CM | POA: Diagnosis not present

## 2018-10-13 DIAGNOSIS — Z833 Family history of diabetes mellitus: Secondary | ICD-10-CM | POA: Insufficient documentation

## 2018-10-13 DIAGNOSIS — Z79899 Other long term (current) drug therapy: Secondary | ICD-10-CM | POA: Diagnosis not present

## 2018-10-13 DIAGNOSIS — O0991 Supervision of high risk pregnancy, unspecified, first trimester: Secondary | ICD-10-CM | POA: Diagnosis not present

## 2018-10-13 DIAGNOSIS — O99331 Smoking (tobacco) complicating pregnancy, first trimester: Secondary | ICD-10-CM | POA: Insufficient documentation

## 2018-10-13 DIAGNOSIS — Z3A12 12 weeks gestation of pregnancy: Secondary | ICD-10-CM | POA: Diagnosis not present

## 2018-10-13 DIAGNOSIS — F1110 Opioid abuse, uncomplicated: Secondary | ICD-10-CM

## 2018-10-13 DIAGNOSIS — F17200 Nicotine dependence, unspecified, uncomplicated: Secondary | ICD-10-CM | POA: Diagnosis not present

## 2018-10-13 DIAGNOSIS — I10 Essential (primary) hypertension: Secondary | ICD-10-CM

## 2018-10-13 DIAGNOSIS — O99321 Drug use complicating pregnancy, first trimester: Secondary | ICD-10-CM | POA: Insufficient documentation

## 2018-10-13 DIAGNOSIS — Z7982 Long term (current) use of aspirin: Secondary | ICD-10-CM | POA: Diagnosis not present

## 2018-10-13 DIAGNOSIS — Z818 Family history of other mental and behavioral disorders: Secondary | ICD-10-CM

## 2018-10-13 DIAGNOSIS — F1120 Opioid dependence, uncomplicated: Secondary | ICD-10-CM | POA: Insufficient documentation

## 2018-10-13 DIAGNOSIS — O34219 Maternal care for unspecified type scar from previous cesarean delivery: Secondary | ICD-10-CM | POA: Insufficient documentation

## 2018-10-13 DIAGNOSIS — Z3682 Encounter for antenatal screening for nuchal translucency: Secondary | ICD-10-CM | POA: Insufficient documentation

## 2018-10-13 DIAGNOSIS — O161 Unspecified maternal hypertension, first trimester: Secondary | ICD-10-CM | POA: Diagnosis not present

## 2018-10-13 DIAGNOSIS — Z8249 Family history of ischemic heart disease and other diseases of the circulatory system: Secondary | ICD-10-CM | POA: Insufficient documentation

## 2018-10-13 HISTORY — DX: Calculus of kidney: N20.0

## 2018-10-13 MED ORDER — ALBUTEROL SULFATE HFA 108 (90 BASE) MCG/ACT IN AERS: 2.0000 | INHALATION_SPRAY | Freq: Four times a day (QID) | RESPIRATORY_TRACT | 2 refills | Status: DC | PRN

## 2018-10-13 NOTE — Progress Notes (Signed)
Elizabeth Shea, [place] Length of Consultation: 50 minutes   Elizabeth Shea  was referred to [facility]  for genetic counseling to review prenatal screening and testing options as well as her family and pregnancy history.  This note summarizes the information we discussed.    We offered the following routine screening tests for this pregnancy:  First trimester screening, which includes nuchal translucency ultrasound screen and first trimester maternal serum marker screening.  The nuchal translucency has approximately an 80% detection rate for Down syndrome and can be positive for other chromosome abnormalities as well as congenital heart defects.  When combined with a maternal serum marker screening, the detection rate is up to 90% for Down syndrome and up to 97% for trisomy 18.     Maternal serum marker screening, a blood test that measures pregnancy proteins, can provide risk assessments for Down syndrome, trisomy 18, and open neural tube defects (spina bifida, anencephaly). Because it does not directly examine the fetus, it cannot positively diagnose or rule out these problems.  Targeted ultrasound uses high frequency sound waves to create an image of the developing fetus.  An ultrasound is often recommended as a routine means of evaluating the pregnancy.  It is also used to screen for fetal anatomy problems (for example, a heart defect) that might be suggestive of a chromosomal or other abnormality.   Should these screening tests indicate an increased concern, then the following additional testing options would be offered:  The chorionic villus sampling procedure is available for first trimester chromosome analysis.  This involves the withdrawal of a small amount of chorionic villi (tissue from the developing placenta).  Risk of pregnancy loss is estimated to be approximately 1 in 200 to 1 in 100 (0.5 to 1%).  There is approximately a 1% (1 in 100) chance that the CVS  chromosome results will be unclear.  Chorionic villi cannot be tested for neural tube defects.     Amniocentesis involves the removal of a small amount of amniotic fluid from the sac surrounding the fetus with the use of a thin needle inserted through the maternal abdomen and uterus.  Ultrasound guidance is used throughout the procedure.  Fetal cells from amniotic fluid are directly evaluated and > 99.5% of chromosome problems and > 98% of open neural tube defects can be detected. This procedure is generally performed after the 15th week of pregnancy.  The main risks to this procedure include complications leading to miscarriage in less than 1 in 200 cases (0.5%).  As another option for information if the pregnancy is suspected to be an an increased chance for certain chromosome conditions, we also reviewed the availability of cell free fetal DNA testing from maternal blood to determine whether or not the baby may have either Down syndrome, trisomy 38, or trisomy 9.  This test utilizes a maternal blood sample and DNA sequencing technology to isolate circulating cell free fetal DNA from maternal plasma.  The fetal DNA can then be analyzed for DNA sequences that are derived from the three most common chromosomes involved in aneuploidy, chromosomes 13, 18, and 21.  If the overall amount of DNA is greater than the expected level for any of these chromosomes, aneuploidy is suspected.  While we do not consider it a replacement for invasive testing and karyotype analysis, a negative result from this testing would be reassuring, though not a guarantee of a normal chromosome complement for the baby.  An abnormal result is certainly suggestive of an  abnormal chromosome complement, though we would still recommend CVS or amniocentesis to confirm any findings from this testing.  Cystic Fibrosis and Spinal Muscular Atrophy (SMA) screening were also discussed with the patient. Both conditions are recessive, which means that  both parents must be carriers in order to have a child with the disease.  Cystic fibrosis (CF) is one of the most common genetic conditions in persons of Caucasian ancestry.  This condition occurs in approximately 1 in 2,500 Caucasian persons and results in thickened secretions in the lungs, digestive, and reproductive systems.  For a baby to be at risk for having CF, both of the parents must be carriers for this condition.  Approximately 1 in 48 Caucasian persons is a carrier for CF.  Current carrier testing looks for the most common mutations in the gene for CF and can detect approximately 90% of carriers in the Caucasian population.  This means that the carrier screening can greatly reduce, but cannot eliminate, the chance for an individual to have a child with CF.  If an individual is found to be a carrier for CF, then carrier testing would be available for the partner. As part of [place]'s newborn screening profile, all babies born in the state of [place] will have a two-tier screening process.  Specimens are first tested to determine the concentration of immunoreactive trypsinogen (IRT).  The top 5% of specimens with the highest IRT values then undergo DNA testing using a panel of over 40 common CF mutations. SMA is a neurodegenerative disorder that leads to atrophy of skeletal muscle and overall weakness.  This condition is also more prevalent in the Caucasian population, with 1 in 40-1 in 60 persons being a carrier and 1 in 6,000-1 in 10,000 children being affected.  There are multiple forms of the disease, with some causing death in infancy to other forms with survival into adulthood.  The genetics of SMA is complex, but carrier screening can detect up to 95% of carriers in the Caucasian population.  Similar to CF, a negative result can greatly reduce, but cannot eliminate, the chance to have a child with SMA.  We obtained a detailed family history and pregnancy history.  Elizabeth Shea and her  partner have had four pregnancies together.  She also has a healthy son from a prior relationship who is 14 years old.  Together, they have a healthy 25 year old son, one early miscarriage, and an 35 year old son who has been diagnosed with autism.  This son was born at 35 weeks and has had ongoing sensory, behavioral and social concerns.  Autism is thought to be inherited in a mutifactorial manner (due to a combination of genetic factors and environmental factors) or as a characteristic of an underlying genetic condition that follows a traditional Mendelian inheritance pattern.  A proposed mechanism for autism is that an affected individual inherits changes in one or more genes associated with susceptibility for autism, and then undergoes some exposure or change during development that causes development of the symptoms of autism.  The suspected underlying genetic changes that predispose an individual to autism are complex, and not completely understood at this time. We would recommend a formal medical genetics evaluation for their son, at which point genetic testing could be facilitated if desired.  Familial aggregation of autistic spectrum disorders is observed, and recurrence risk for a subsequent affected child for a couple who has a child with autistic spectrum disorder is approximately 5%, but may be  higher depending on the specific characteristics of the affected child.  We also discussed the option of screening for Fragile X syndrome, which is one of the most common causes of inherited mental retardation in males.  The remainder of the family history was reported to be unremarkable for birth defects, intellectual delays, recurrent pregnancy loss or known chromosome abnormalities. In addition, the father of the baby has a paternal first cousin with learning disabilities.  Similar to autism, we reviewed that there may be many reasons for learning differences including inherited as well as other causes.  More  information would be needed to assess the chance for other family members to have learning differences.  Again, we talked about Fragile X syndrome screening. Lastly, the father of the baby has a history of seizures that began in adulthood following a recreational drug overdose.  He reported that a distant cousin and great grandfather also had seizures and that he was told by his neurologist that this was genetic.  He reported no neurocutaneous features suggestive of a genetic syndrome and given the fact that his began after the overdose, we would expect his seizures to be perhaps due to a combination of genetic and lifestyle factors and would estimate a low chance for recurrence in this children, though that chance is likely higher than the background population chance.  The remainder of the family history was unremarkable for birth defects, intellectual disabilities or known genetic conditions.  Elizabeth Shea had bleeding early in this pregnancy and has an exposure to suboxone obtained in the community, marijuana and tobacco use daily.  The use of tobacco and marijuana in pregnancy are both known to be associated with low birth weight and premature delivery.  We therefore suggested the patient avoid smoking during this time. We referred Elizabeth Shea back to her OB to get a referral for a substance abuse program, as she would like to pursue stopping the Suboxone.  After consideration of the options, Elizabeth Shea elected to proceed with first trimester screening and to decline carrier screening.  An ultrasound was performed at the time of the visit.  The gestational age was consistent with 12 weeks.  Fetal anatomy could not be assessed due to early gestational age.  Please refer to the ultrasound report for details of that study.  Elizabeth Shea was encouraged to call with questions or concerns.  We can be contacted at [phone].  Labs ordered: first trimester screening  Cherly Andersoneborah F. Melonie Germani, MS, CGC

## 2018-10-13 NOTE — Progress Notes (Addendum)
[facility]aternal-[facility]etal [facility] Consultation    HPI: Ms. Elizabeth Shea is a 28 y.o. G5P0 at [redacted]w[redacted]d who presents in consultation from  [facility] for h/o substance use (suboxone, purchased in community), [place], spont preterm birth at 34 weeks, mild asthma, tobacco use.   She had the opportunity to meet with our genetic counselor today (please see that note for details) and elected to have first trimester screening.  She is here today with her partner, Elizabeth Shea.   She reports strong desire to stop suboxone but cannot due to withdrawal sxs.  She is afraid of NAS.  She reports complete cessation of tobacco and opoid/opoid replacement during previous pregnancies.    Past Medical History: Patient  has a past medical history of Asthma, Hypertension, and Kidney stones.  Past Surgical History: She  has a past surgical history that includes Cholecystectomy and Cesarean section.  Obstetric History:  OB History    Gravida  5   Para      Term      Preterm      AB      Living  3     SAB      TAB      Ectopic      Multiple      Live Births            Obstetric history 2008, c/s, 39/5 weeks, 8lb 9oz, arrest of descent 2009, c/s 34 weeks, 5lb 14 oz, spont preterm labor--autism 2012, c/s, 37/2 weeks, 6lb 13 oz   Gynecologic History:  Patient's last menstrual period was 07/17/2018.     Current Outpatient Medications on File Prior to Encounter  Medication Sig Dispense Refill  . buprenorphine (SUBUTEX) 8 MG SUBL SL tablet Place 8 mg under the tongue daily. Pt takes a sliver of this pill a day that is not prescribed.     No current facility-administered medications on file prior to encounter.    Allergies: Patient is allergic to tramadol.  Social History: Patient  reports that she has been smoking. She has a 4.00 pack-year smoking history. She has never used smokeless tobacco. She reports current drug use. She reports that she does not drink alcohol. Using [employer] in community.   Family  History: family history includes Autism in her son; Cancer in her paternal grandmother; Diabetes in her paternal grandmother; Hypertension in her father and mother; Seizures in her mother.   bp 138/93  [place] today   Impression/plan 1.  Substance use--she is intermittently using personally acquired suboxone.  Suboxone is an excellent opoid replacement agent that will lower the risk for neonatal abstinence.  She is worried about NAS but we addressed potential harm with acute cessation during pregnancy and benefits of a constant, replacement therapy and recovery support program.    She will benefit from community based resources.  --She will need close support through Engineer, maintenance during and after pregnancy.  --she was previously offered referral to [facility]" by her Pregnancy Care Manager, Arlana Hove. She had previously declined support because she wanted to stop suboxone but could not due to physical withdrawal sxs.  She now would like to enter substance use treatment and continue prescribed suboxone maintenance. We called for name/number of her PCM during this visit and gave information.  She will request referral.    2. cHTN--The primary risks associated with hypertension in pregnancy include the risk for superimposed preeclampsia, fetal growth restriction, and abruption.  Use of low dose 'baby' aspirin daily can reduce  the risk for superimposed preeclampsia by approximately 15%.   --recommend daily baby aspirin --recommend follow up detailed anatomic survey at ~18w and follow up growth in the third trimester --recommend weekly antenatal testing beginning at 36 weeks --recommend delivery in 39th week (unless indicated sooner)  3. Tobacco use--we addressed primary risks associated with tobacco use including risk for fetal growth restriction and abruption --she should also be referred for smoking cessation support services.    4. Prior late (34w) preterm  birth---she is aware of the increased risk for recurrent preterm birth and was planning to begin 17OHP between 16-18 week.  --We also addressed the option of cervical length surveillance and offering nightly vaginal progesterone should she demonstrate evidence of a short cervix---she would like to try the vaginal progesterone --we will schedule her anatomic survey and cervical length at ~18w  5. Asthma--she reports mild disease but does desire a refill of her albuterol inhaler to take prn   Total time spent with the patient was 30 minutes with greater than 50% spent in counseling and coordination of care. We appreciate this interesting consult and will be happy to be involved in the ongoing care of Ms. Elizabeth Shea in anyway her obstetricians desire.  Consuelo Pandy, MD Maternal-Fetal Medicine [facility]

## 2018-10-13 NOTE — ED Notes (Signed)
Pt expressed to Dr. Diamantina Monks (MFM at [facility]) that she did not have a  Pregnancy Coordinator was at [place].  I called [facility] to see who she was assigned to.  Per Tye Maryland at [facility]. Rasheeda had been assigned to her and met with her on her 1st visit there. Pt was also given a referral to [facility] at that visit.  Per [facility] when [facility] called pt to set up referral pt declined. Pt given the phone number to reach her pregnancy care coordinator at [facility] by Dr. Diamantina Monks.

## 2018-10-17 ENCOUNTER — Telehealth: Payer: Self-pay | Admitting: Obstetrics and Gynecology

## 2018-10-17 NOTE — Telephone Encounter (Signed)
  Elizabeth Shea elected to undergo First Trimester screening on 10/13/2018.  To review, first trimester screening, includes nuchal translucency ultrasound screen and/or first trimester maternal serum marker screening.  The nuchal translucency has approximately an 80% detection rate for Down syndrome and can be positive for other chromosome abnormalities as well as heart defects.  When combined with a maternal serum marker screening, the detection rate is up to 90% for Down syndrome and up to 97% for trisomy 13 and 18.     The results of the First Trimester Nuchal Translucency and Biochemical Screening were within normal range.  The risk for Down syndrome is now estimated to be less than 1 in 10,000.  The risk for Trisomy 13/18 is also estimated to be less than 1 in 10,000.  Should more definitive information be desired, we would offer amniocentesis.  Because we do not yet know the effectiveness of combined first and second trimester screening, we do not recommend a maternal serum screen to assess the chance for chromosome conditions.  However, if screening for neural tube defects is desired, maternal serum screening for AFP only can be performed between 15 and [redacted] weeks gestation.      Cherly Anderson, MS, CGC

## 2018-10-20 NOTE — Progress Notes (Signed)
Pt seen by me, agree with assessment and plan as outlined by Harlan Stains

## 2018-10-24 ENCOUNTER — Encounter: Payer: Self-pay | Admitting: Family Medicine

## 2018-10-24 DIAGNOSIS — O34219 Maternal care for unspecified type scar from previous cesarean delivery: Secondary | ICD-10-CM | POA: Insufficient documentation

## 2018-10-24 DIAGNOSIS — O99330 Smoking (tobacco) complicating pregnancy, unspecified trimester: Secondary | ICD-10-CM | POA: Insufficient documentation

## 2018-10-24 DIAGNOSIS — O09219 Supervision of pregnancy with history of pre-term labor, unspecified trimester: Secondary | ICD-10-CM | POA: Insufficient documentation

## 2018-10-25 DIAGNOSIS — Z8489 Family history of other specified conditions: Secondary | ICD-10-CM | POA: Insufficient documentation

## 2018-11-10 ENCOUNTER — Other Ambulatory Visit: Payer: Self-pay

## 2018-11-14 ENCOUNTER — Ambulatory Visit
Admission: RE | Admit: 2018-11-14 | Discharge: 2018-11-14 | Disposition: A | Discharge status: Home / Self Care | Payer: Medicaid Other | Source: Ambulatory Visit | Attending: Maternal & Fetal Medicine | Admitting: Maternal & Fetal Medicine

## 2018-11-14 ENCOUNTER — Other Ambulatory Visit: Payer: Self-pay

## 2018-11-14 DIAGNOSIS — O0992 Supervision of high risk pregnancy, unspecified, second trimester: Secondary | ICD-10-CM | POA: Insufficient documentation

## 2018-11-14 DIAGNOSIS — Z3A17 17 weeks gestation of pregnancy: Secondary | ICD-10-CM | POA: Insufficient documentation

## 2018-11-17 ENCOUNTER — Inpatient Hospital Stay: Admission: RE | Admit: 2018-11-17 | Payer: Self-pay | Source: Ambulatory Visit

## 2018-11-17 ENCOUNTER — Encounter: Payer: Self-pay | Admitting: Obstetrics and Gynecology

## 2018-11-17 NOTE — Progress Notes (Signed)
  OB Consult addendum  Elizabeth Shea is a 28 y.o. G5P0 at 36 w 4d  consultation from  [facility] for h/o substance use (suboxone, purchased in community), [place], spont preterm birth at 34 weeks, mild asthma, tobacco use.    Obstetric history 2008, c/s, 39/5 weeks, 8lb 9oz, arrest of descent 2009, c/s 34 weeks, 5lb 14 oz, spont preterm labor--autism 2012, c/s, 37/2 weeks, 6lb 13 oz   At her last visit  Dr Dolphus Jenny recommended  "Prior late (34w) preterm birth---she is aware of the increased risk for recurrent preterm birth and was planning to begin 17OHP between 16-18 week.  --We also addressed the option of cervical length surveillance and offering nightly vaginal progesterone should she demonstrate evidence of a short cervix---she would like to try the vaginal progesterone --we will schedule her anatomic survey and cervical length at ~18w"  [facility] would like direction as the pt is now  Declining  17 P -    - recent RCT PROLONG study did not show benefit of 17 P - we have offered 17P  for concerned pts with no other intervention available after discussion , So if she decides she does not want 17 P reasonable to discontinue .  If pt needs care for suboxone maintenance I can help arrange - I work with the internal medicine group at [facility] and have my waiver if she is unable to find local assistance  Jimmey Ralph Pager [phone]

## 2018-11-21 ENCOUNTER — Encounter: Payer: Self-pay | Admitting: Radiology

## 2018-11-24 ENCOUNTER — Other Ambulatory Visit: Payer: Self-pay

## 2018-11-24 DIAGNOSIS — O0992 Supervision of high risk pregnancy, unspecified, second trimester: Secondary | ICD-10-CM

## 2018-11-28 ENCOUNTER — Ambulatory Visit
Admission: RE | Admit: 2018-11-28 | Discharge: 2018-11-28 | Disposition: A | Discharge status: Home / Self Care | Payer: Medicaid Other | Source: Ambulatory Visit | Attending: Obstetrics and Gynecology | Admitting: Obstetrics and Gynecology

## 2018-11-28 ENCOUNTER — Inpatient Hospital Stay: Admission: RE | Admit: 2018-11-28 | Payer: Self-pay | Source: Ambulatory Visit

## 2018-11-28 ENCOUNTER — Other Ambulatory Visit: Payer: Self-pay

## 2018-11-28 DIAGNOSIS — O0992 Supervision of high risk pregnancy, unspecified, second trimester: Secondary | ICD-10-CM | POA: Insufficient documentation

## 2018-11-28 DIAGNOSIS — F17210 Nicotine dependence, cigarettes, uncomplicated: Secondary | ICD-10-CM | POA: Insufficient documentation

## 2018-11-28 DIAGNOSIS — N644 Mastodynia: Secondary | ICD-10-CM | POA: Insufficient documentation

## 2018-11-28 DIAGNOSIS — F840 Autistic disorder: Secondary | ICD-10-CM | POA: Diagnosis not present

## 2018-11-28 DIAGNOSIS — O163 Unspecified maternal hypertension, third trimester: Secondary | ICD-10-CM | POA: Diagnosis not present

## 2018-11-28 DIAGNOSIS — O26893 Other specified pregnancy related conditions, third trimester: Secondary | ICD-10-CM | POA: Diagnosis not present

## 2018-11-28 DIAGNOSIS — Z3A34 34 weeks gestation of pregnancy: Secondary | ICD-10-CM | POA: Diagnosis not present

## 2018-11-28 DIAGNOSIS — O99343 Other mental disorders complicating pregnancy, third trimester: Secondary | ICD-10-CM | POA: Diagnosis not present

## 2018-11-28 DIAGNOSIS — O99333 Smoking (tobacco) complicating pregnancy, third trimester: Secondary | ICD-10-CM | POA: Diagnosis not present

## 2018-11-28 NOTE — Progress Notes (Signed)
Elizabeth Shea a 28 y.o.G5P0 at 65 w 1d  h/o substance use (suboxone, purchased in community), cHTN,spont preterm birth at 82 weeks, mildasthma, tobacco use.   Obstetric history 2008, c/s, 39/5 weeks, 8lb 9oz, arrest of descent 2009, c/s 34 weeks, 5lb 14 oz, spont preterm labor--autism 2012, c/s, 37/2 weeks, 6lb 13 oz   Patient declined 17 P injections.  I sent a note to the health department to cancel those. Today she comes in for an ultrasound to follow-up her cervix which was normal length and to complete the anatomy checklist which was normal. The patient had multiple questions for the nurse.  She reports that she got in touch [facility].  The patient states she was able to taper her Suboxone in prior pregnancies and wishes to do the same this time.  She does not want to baby to have neonatal abstinence syndrome.  [facility] was unwilling to taper her and so she is continue to use Suboxone from the street and she is cutting the films into small strips that she uses twice daily.  She states when she wakes up she feels flushed and thinks that is consistent with her withdrawal symptoms. I reviewed with her that our national professional organization recommends continuing opioid replacement or Suboxone during pregnancy.  I told her there is concern that if she discontinues and then resumes use she might be at risk for overdose.  I also told her that withdrawal can be associated with blood pressure fluctuations high risk of abruption and preterm labor.  We offered the patient a name of a treatment program in [place] if she were looking for supportive psychotherapy during pregnancy.  I reviewed with her the need to quit smoking we discussed that having her husband quit smoking with her would be the best approach.  We did not discuss pharmacologic options.  The patient complained of breast pain in her right breast at 6:00 performed a breast exam in the clinic and there  was no mass felt the breast seemed symmetric and similar to the left breast patient notes her grandmother had a history of breast cancer.  I advised her that if she feels a mass or if the pain gets worse that we could arrange for breast imaging during the pregnancy.  The patient's hypertension appears better  Today-127/93 was her blood pressure she is on no medications  The patient complains of nightly headaches not relieved by Tylenol she has been using BC powders.  I recommended to her that she not use BC powders due to the aspirin content.  Patient has some congestion I recommended she try an antihistamine to be used Benadryl to help her sleep or Flonase advised her  To not use a vasoconstricting nasal spray such as Afrin. The patient will return on June 15 I recommended we added a consult on at that same time due to her many questions as well as the follow-up ultrasound for growth.   Jimmey Ralph

## 2019-01-05 ENCOUNTER — Other Ambulatory Visit: Payer: Self-pay

## 2019-01-05 DIAGNOSIS — O0992 Supervision of high risk pregnancy, unspecified, second trimester: Secondary | ICD-10-CM

## 2019-01-09 ENCOUNTER — Inpatient Hospital Stay
Admission: RE | Admit: 2019-01-09 | Discharge: 2019-01-09 | Disposition: A | Discharge status: Home / Self Care | Payer: Self-pay | Source: Ambulatory Visit

## 2019-01-09 ENCOUNTER — Ambulatory Visit: Payer: Medicaid Other

## 2019-01-12 ENCOUNTER — Ambulatory Visit: Payer: Medicaid Other

## 2019-01-23 ENCOUNTER — Ambulatory Visit: Payer: Medicaid Other

## 2019-02-03 ENCOUNTER — Encounter: Payer: Self-pay | Admitting: Family Medicine

## 2019-02-03 ENCOUNTER — Telehealth: Payer: Self-pay | Admitting: Family Medicine

## 2019-02-03 NOTE — Telephone Encounter (Signed)
Per staff message from Lorayne Bender: Called client at number [phone] today per request from K . Ernestina Patches , spoke with client and she gives c/o having anxiety about the follow up she is getting at [facility] and that she has next appt today 7/04-2019 from 5-8 pm . Client also explains she has a head cold and congestion along with sore throat , she denies any fever . After calling Dr Ernestina Patches , plan is to call back client today and have her call COVID line for possible testing , if goes for testing will have client call maternity to inform of this as her next scheduled appt is 02/13/2019 at [facility] . This nurse will also encourage the client to talk with Ellender Hose today about her questions with her treatment and suboxone questions .

## 2019-02-03 NOTE — Telephone Encounter (Signed)
Call to ll

## 2019-02-09 DIAGNOSIS — O09219 Supervision of pregnancy with history of pre-term labor, unspecified trimester: Secondary | ICD-10-CM

## 2019-02-09 DIAGNOSIS — F1110 Opioid abuse, uncomplicated: Secondary | ICD-10-CM

## 2019-02-09 DIAGNOSIS — O0990 Supervision of high risk pregnancy, unspecified, unspecified trimester: Secondary | ICD-10-CM

## 2019-02-09 DIAGNOSIS — Z915 Personal history of self-harm: Secondary | ICD-10-CM

## 2019-02-09 DIAGNOSIS — IMO0002 Reserved for concepts with insufficient information to code with codable children: Secondary | ICD-10-CM

## 2019-02-09 DIAGNOSIS — Z8489 Family history of other specified conditions: Secondary | ICD-10-CM

## 2019-02-09 DIAGNOSIS — O34219 Maternal care for unspecified type scar from previous cesarean delivery: Secondary | ICD-10-CM

## 2019-02-09 DIAGNOSIS — O99330 Smoking (tobacco) complicating pregnancy, unspecified trimester: Secondary | ICD-10-CM

## 2019-02-10 NOTE — Telephone Encounter (Signed)
This encounter was created in error - please disregard.

## 2019-02-13 ENCOUNTER — Ambulatory Visit: Payer: Self-pay

## 2019-02-20 ENCOUNTER — Telehealth: Payer: Self-pay | Admitting: Licensed Clinical Social Worker

## 2019-02-20 ENCOUNTER — Other Ambulatory Visit: Payer: Self-pay | Admitting: Family Medicine

## 2019-02-20 ENCOUNTER — Other Ambulatory Visit: Payer: Self-pay

## 2019-02-20 ENCOUNTER — Ambulatory Visit: Payer: Medicaid Other | Admitting: Family Medicine

## 2019-02-20 VITALS — BP 114/78 | Temp 97.3°F | Wt 200.8 lb

## 2019-02-20 DIAGNOSIS — Z23 Encounter for immunization: Secondary | ICD-10-CM | POA: Diagnosis not present

## 2019-02-20 DIAGNOSIS — O0990 Supervision of high risk pregnancy, unspecified, unspecified trimester: Secondary | ICD-10-CM

## 2019-02-20 DIAGNOSIS — O09219 Supervision of pregnancy with history of pre-term labor, unspecified trimester: Secondary | ICD-10-CM

## 2019-02-20 DIAGNOSIS — K219 Gastro-esophageal reflux disease without esophagitis: Secondary | ICD-10-CM

## 2019-02-20 DIAGNOSIS — O219 Vomiting of pregnancy, unspecified: Secondary | ICD-10-CM

## 2019-02-20 DIAGNOSIS — O34219 Maternal care for unspecified type scar from previous cesarean delivery: Secondary | ICD-10-CM

## 2019-02-20 LAB — OB RESULTS CONSOLE HIV ANTIBODY (ROUTINE TESTING): HIV: NONREACTIVE

## 2019-02-20 MED ORDER — OMEPRAZOLE 20 MG PO CPDR: 20.0000 mg | DELAYED_RELEASE_CAPSULE | Freq: Every day | ORAL | 3 refills | Status: DC

## 2019-02-20 MED ORDER — ONDANSETRON 4 MG PO TBDP: 4.0000 mg | ORAL_TABLET | Freq: Four times a day (QID) | ORAL | 0 refills | Status: DC | PRN

## 2019-02-20 NOTE — Progress Notes (Signed)
PRENATAL VISIT NOTE  Subjective:  Elizabeth Shea is a 28 y.o. G5P0 at [redacted]w[redacted]d being seen today for ongoing prenatal care.  She is currently monitored for the following issues for this high-risk pregnancy and has Opiate abuse, episodic ([place]); Cannabis abuse; Family history of autism; History of cesarean delivery affecting pregnancy; Previous preterm labor affecting pregnancy, antepartum; Tobacco use affecting pregnancy, antepartum; Supervision of high risk pregnancy, multigravida, antepartum; Obesity, unspecified; Hypertension; Bipolar disorder, unspecified ([place]); Attention deficit hyperactivity disorder; Hx of self-harm; and Family history of seizures on their problem list.  Patient reports fatigue, heartburn and nausea.  Contractions: Not present. Vag. Bleeding: None.  Movement: Present. Denies leaking of fluid/ROM.   The following portions of the patient's history were reviewed and updated as appropriate: allergies, current medications, past family history, past medical history, past social history, past surgical history and problem list. Problem list updated.  Objective:   Vitals:   02/20/19 1557  BP: 114/78  Temp: (!) 97.3 F (36.3 C)  Weight: 200 lb 12.8 oz (91.1 kg)    Fetal Status: Fetal Heart Rate (bpm): 145 Fundal Height: 32 cm Movement: Present     General:  Alert, oriented and cooperative. Patient is in no acute distress.  Skin: Skin is warm and dry. No rash noted.   Cardiovascular: Normal heart rate noted  Respiratory: Normal respiratory effort, no problems with respiration noted  Abdomen: Soft, gravid, appropriate for gestational age.  Pain/Pressure: Present     Pelvic: Cervical exam deferred        Extremities: Normal range of motion.  Edema: Trace  Mental Status: Normal mood and affect. Normal behavior. Normal judgment and thought content.   Assessment and Plan:  Pregnancy: G5P0 at [redacted]w[redacted]d  1. Gastroesophageal reflux disease without esophagitis  Patient with daily nausea  and reports depression related to feeling bad. Reviewed behavioral changes to help nausea.  TWG=12.8 oz (0.363 kg), of note patient has gained sufficient weight this pregnancy based on obesity  Reviewed use of OTC medications, specifically use of dramamine given sensation of increased salivation.  - omeprazole (PRILOSEC) 20 MG capsule; Take 1 capsule (20 mg total) by mouth daily.  Dispense: 30 capsule; Refill: 3 - ondansetron (ZOFRAN ODT) 4 MG disintegrating tablet; Take 1 tablet (4 mg total) by mouth every 6 (six) hours as needed for nausea.  Dispense: 20 tablet; Refill: 0  2. Supervision of high risk pregnancy, multigravida, antepartum 28 wk labs today Patient refused 1hr GTT and thus performed random glucose (ate ~2 hrs ago). Recommend another glucose at next visit at the least but if nausea is improved then need 1 hr GTT.   3. Previous preterm labor affecting pregnancy, antepartum Counseled by MFM that she does not need 17 P  Monitor for s/sx  4. History of cesarean delivery affecting pregnancy Needs repeat CS Referral to [facility] for delivery plans appt placed today  5. Nausea and vomiting of pregnancy, antepartum - ondansetron (ZOFRAN ODT) 4 MG disintegrating tablet; Take 1 tablet (4 mg total) by mouth every 6 (six) hours as needed for nausea.  Dispense: 20 tablet; Refill: 0  6. Opiate disorder Has established with [facility] and receiving suboxone  Counseled to continue care   Preterm labor symptoms and general obstetric precautions including but not limited to vaginal bleeding, contractions, leaking of fluid and fetal movement were reviewed in detail with the patient. Please refer to After Visit Summary for other counseling recommendations.  Return in about 2 weeks (around 03/06/2019) for Routine prenatal care,  in person.  Future Appointments  Date Time Provider Department Center  03/06/2019  3:00 PM [facility] PROVIDER [facility] None    Federico FlakeKimberly Niles [place], MD

## 2019-02-20 NOTE — Progress Notes (Signed)
Not taking PNV and declines 1hgtt today due to N/V. Needs 28 week labs today,Tdap given. CCNC, PHQ9, PTL s/s info and Kick Count cards and instructions given. Needs Blood Transfusion Consent form signed. States last food intake was "a handful of Cheerios and Skittles about an hour ago." BTL Consent Form signed today. Hal Morales, RN

## 2019-02-20 NOTE — Telephone Encounter (Signed)
Elizabeth Shea, MSW, OBCM referred patient to LCSW due to hx of opioid use, current marijuana use, and symptoms of depression.

## 2019-02-21 ENCOUNTER — Encounter: Payer: Self-pay | Admitting: Family Medicine

## 2019-02-21 LAB — URINALYSIS
Bilirubin, UA: POSITIVE — AB
Glucose, UA: NEGATIVE
Leukocytes,UA: NEGATIVE
Nitrite, UA: NEGATIVE
RBC, UA: NEGATIVE
Specific Gravity, UA: 1.025 (ref 1.005–1.030)
Urobilinogen, Ur: 8 mg/dL — ABNORMAL HIGH (ref 0.2–1.0)
pH, UA: 7 (ref 5.0–7.5)

## 2019-02-21 LAB — HEMOGLOBIN, FINGERSTICK: Hemoglobin: 12.5 g/dL (ref 11.1–15.9)

## 2019-02-21 NOTE — Telephone Encounter (Signed)
Left message with patient's partner for patient to return call. Provided number to partner.

## 2019-02-22 ENCOUNTER — Telehealth: Payer: Self-pay | Admitting: Obstetrics & Gynecology

## 2019-02-22 LAB — RPR: RPR Ser Ql: NONREACTIVE

## 2019-02-22 LAB — GLUCOSE, FASTING: Glucose, Plasma: 70 mg/dL (ref 65–99)

## 2019-02-22 NOTE — Telephone Encounter (Signed)
[facility] referring for Delivery plans appointment, repeat CS with BTL (papers signed today 7/28).Attempt to reach patient voicemail box not set up unable to leave message to call back to schedule

## 2019-02-22 NOTE — Telephone Encounter (Signed)
Attempted call to patient- vm is not set up and was not able to leave message.

## 2019-02-24 NOTE — Telephone Encounter (Signed)
voicemail box not set up unable to leave message to call back to schedule

## 2019-02-27 NOTE — Telephone Encounter (Signed)
Talked with patient. Patient reported that she is now on a medication for nausea and she feels significant benefit. She reports her mood has improved and declines services at this time. LCSW encouraged patient to reach out in the the future, if needed.

## 2019-03-03 ENCOUNTER — Other Ambulatory Visit: Payer: Self-pay | Admitting: Family Medicine

## 2019-03-03 DIAGNOSIS — K219 Gastro-esophageal reflux disease without esophagitis: Secondary | ICD-10-CM

## 2019-03-03 DIAGNOSIS — O219 Vomiting of pregnancy, unspecified: Secondary | ICD-10-CM

## 2019-03-06 ENCOUNTER — Telehealth (LOCAL_COMMUNITY_HEALTH_CENTER): Payer: Medicaid Other

## 2019-03-06 ENCOUNTER — Ambulatory Visit: Payer: Medicaid Other

## 2019-03-06 DIAGNOSIS — O219 Vomiting of pregnancy, unspecified: Secondary | ICD-10-CM

## 2019-03-06 NOTE — Telephone Encounter (Signed)
Patient called requesting to reschedule today's missed [facility] appt and to have provider "call in a refill on the Zofran medicine." Patient states "I haven't vomited at all since the doctor gave me this medicine." Patient was initially prescribed above medication by K. Ernestina Patches, MD at last prenatal appt. of 02/20/19. [facility] appt rescheduled for 03/08/2019 @ 3:20. K. Dia Sitter, [place] notified of request for medication refill. Hal Morales, RN

## 2019-03-06 NOTE — Telephone Encounter (Signed)
Patient scheduled 8/13 at 2:30 with [facility]

## 2019-03-06 NOTE — Telephone Encounter (Signed)
Called and spoke with Patient's husband, to let him know we need patient to give [place] a call so we can get her schedule. Generic message left

## 2019-03-07 MED ORDER — ONDANSETRON HCL 4 MG PO TABS: 4.0000 mg | ORAL_TABLET | Freq: Three times a day (TID) | ORAL | 0 refills | Status: DC | PRN

## 2019-03-07 NOTE — Addendum Note (Signed)
Addended by: Jerline Pain on: 03/07/2019 05:10 PM   Modules accepted: Orders, Level of Service

## 2019-03-07 NOTE — Telephone Encounter (Signed)
Patient needs refill on zofran  

## 2019-03-08 ENCOUNTER — Ambulatory Visit: Payer: Self-pay

## 2019-03-09 ENCOUNTER — Other Ambulatory Visit: Payer: Self-pay

## 2019-03-09 ENCOUNTER — Ambulatory Visit (INDEPENDENT_AMBULATORY_CARE_PROVIDER_SITE_OTHER): Payer: Medicaid Other | Admitting: Obstetrics & Gynecology

## 2019-03-09 ENCOUNTER — Encounter: Payer: Self-pay | Admitting: Obstetrics & Gynecology

## 2019-03-09 VITALS — BP 100/70 | Wt 202.0 lb

## 2019-03-09 DIAGNOSIS — Z98891 History of uterine scar from previous surgery: Secondary | ICD-10-CM | POA: Diagnosis not present

## 2019-03-09 DIAGNOSIS — Z3009 Encounter for other general counseling and advice on contraception: Secondary | ICD-10-CM | POA: Diagnosis not present

## 2019-03-09 MED ORDER — NICOTINE 21 MG/24HR TD PT24: 21.0000 mg | MEDICATED_PATCH | Freq: Every day | TRANSDERMAL | 0 refills | Status: DC

## 2019-03-09 NOTE — Progress Notes (Signed)
History and Physical  [name] is a 28 y.o. G5P0 5326w4d delivery planning visit from the [facility], Dr Alvester MorinNewton.  See labor record for pregnancy highlights.  No recent pain, bleeding, ruptured membranes, or other signs of progressing labor.  She has prior h/o 3 cesarean sections, last one 8 years ago.  No reported prior problems w CS.  Also desires permanent sterilization.  Problems this pregnancy- Suboxone use for substance abuse/disorder, sees [facility] Tobacco abuse, unable to quit Obesity, BMI 30-40 Prior CS x3   PMHx: She  has a past medical history of Asthma, Hypertension, and Kidney stones. Also,  has a past surgical history that includes Cholecystectomy and Cesarean section., family history includes Autism in her son; Cancer in her paternal grandmother; Diabetes in her paternal grandmother; Hypertension in her father and mother; Seizures in her mother.,  reports that she has been smoking. She has a 4.00 pack-year smoking history. She has never used smokeless tobacco. She reports current drug use. She reports that she does not drink alcohol. She has a current medication list which includes the following prescription(s): buprenorphine, ondansetron, albuterol, aspirin ec, nicotine, omeprazole, and ondansetron. Also, is allergic to tramadol. OB History  Gravida Para Term Preterm AB Living  5 4 1 2 1 3   SAB TAB Ectopic Multiple Live Births               # Outcome Date GA Lbr Len/2nd Weight Sex Delivery Anes PTL Lv  5 Current           4 Gravida      CS     3 Gravida      CS     2 Gravida      CS     1 Gravida              Patient denies any other pertinent gynecologic issues.   Review of Systems  Constitutional: Negative for chills, fever and malaise/fatigue.  HENT: Negative for congestion, sinus pain and sore throat.   Eyes: Negative for blurred vision and pain.  Respiratory: Negative for cough and wheezing.   Cardiovascular: Negative for chest pain and leg swelling.   Gastrointestinal: Negative for abdominal pain, constipation, diarrhea, heartburn, nausea and vomiting.  Genitourinary: Negative for dysuria, frequency, hematuria and urgency.  Musculoskeletal: Negative for back pain, joint pain, myalgias and neck pain.  Skin: Negative for itching and rash.  Neurological: Negative for dizziness, tremors and weakness.  Endo/Heme/Allergies: Does not bruise/bleed easily.  Psychiatric/Behavioral: Negative for depression. The patient is not nervous/anxious and does not have insomnia.     Objective: BP 100/70   Wt 202 lb (91.6 kg)   LMP 07/17/2018   BMI 35.78 kg/m  Physical Exam Constitutional:      General: She is not in acute distress.    Appearance: She is well-developed.  Musculoskeletal: Normal range of motion.  Neurological:     Mental Status: She is alert and oriented to person, place, and time.  Skin:    General: Skin is warm and dry.  Vitals signs reviewed.   Gravid NT FHT 140s  Assessment:1. History of 3 cesarean sections 2. Sterilization consult  Plan: CS w BTL 04/18/2019 at 39 + weeks EGA.    Consent reviewed w pt, already signed for tubal consent Nicotine patch and attempt to quit advised and prescribed  Patient has been fully informed of the pros and cons, risks and benefits of continued observation with fetal monitoring versus that of induction of labor.  She understands that there are uncommon risks to induction, which include but are not limited to : frequent or prolonged uterine contractions, fetal distress, uterine rupture, and lack of successful induction.  These risks include all methods including Pitocin and Misoprostol and Cervadil.  Patient understands that using Misoprostol for labor induction is an "off label" indication although it has been studied extensively for this purpose and is an accepted method of induction.  She also has been informed of the increased risks for Cesarean with induction and should induction not be  successful.  Patient consents to the induction plan of management.  Plans to breast feed Plans bilateral tubal ligation for contraception TDaP UTD  Barnett Applebaum, MD, [facility], [facility] 03/09/2019  3:06 PM

## 2019-03-09 NOTE — Patient Instructions (Signed)
Cesarean Delivery Cesarean birth, or cesarean delivery, is the surgical delivery of a baby through an incision in the abdomen and the uterus. This may be referred to as a C-section. This procedure may be scheduled ahead of time, or it may be done in an emergency situation. Tell a health care provider about:  Any allergies you have.  All medicines you are taking, including vitamins, herbs, eye drops, creams, and over-the-counter medicines.  Any problems you or family members have had with anesthetic medicines.  Any blood disorders you have.  Any surgeries you have had.  Any medical conditions you have.  Whether you or any members of your family have a history of deep vein thrombosis (DVT) or pulmonary embolism (PE). What are the risks? Generally, this is a safe procedure. However, problems may occur, including:  Infection.  Bleeding.  Allergic reactions to medicines.  Damage to other structures or organs.  Blood clots.  Injury to your baby. What happens before the procedure? General instructions  Follow instructions from your health care provider about eating or drinking restrictions.  If you know that you are going to have a cesarean delivery, do not shave your pubic area. Shaving before the procedure may increase your risk of infection.  Plan to have someone take you home from the hospital.  Ask your health care provider what steps will be taken to prevent infection. These may include: ? Removing hair at the surgery site. ? Washing skin with a germ-killing soap. ? Taking antibiotic medicine.  Depending on the reason for your cesarean delivery, you may have a physical exam or additional testing, such as an ultrasound.  You may have your blood or urine tested. Questions for your health care provider  Ask your health care provider about: ? Changing or stopping your regular medicines. This is especially important if you are taking diabetes medicines or blood thinners.  ? Your pain management plan. This is especially important if you plan to breastfeed your baby. ? How long you will be in the hospital after the procedure. ? Any concerns you may have about receiving blood products, if you need them during the procedure. ? Cord blood banking, if you plan to collect your baby's umbilical cord blood.  You may also want to ask your health care provider: ? Whether you will be able to hold or breastfeed your baby while you are still in the operating room. ? Whether your baby can stay with you immediately after the procedure and during your recovery. ? Whether a family member or a person of your choice can go with you into the operating room and stay with you during the procedure, immediately after the procedure, and during your recovery. What happens during the procedure?   An IV will be inserted into one of your veins.  Fluid and medicines, such as antibiotics, will be given before the surgery.  Fetal monitors will be placed on your abdomen to check your baby's heart rate.  You may be given a special warming gown to wear to keep your temperature stable.  A catheter may be inserted into your bladder through your urethra. This drains your urine during the procedure.  You may be given one or more of the following: ? A medicine to numb the area (local anesthetic). ? A medicine to make you fall asleep (general anesthetic). ? A medicine (regional anesthetic) that is injected into your back or through a small thin tube placed in your back (spinal anesthetic or epidural anesthetic).   This numbs everything below the injection site and allows you to stay awake during your procedure. If this makes you feel nauseous, tell your health care provider. Medicines will be available to help reduce any nausea you may feel.  An incision will be made in your abdomen, and then in your uterus.  If you are awake during your procedure, you may feel tugging and pulling in your abdomen,  but you should not feel pain. If you feel pain, tell your health care provider immediately.  Your baby will be removed from your uterus. You may feel more pressure or pushing while this happens.  Immediately after birth, your baby will be dried and kept warm. You may be able to hold and breastfeed your baby.  The umbilical cord may be clamped and cut during this time. This usually occurs after waiting a period of 1-2 minutes after delivery.  Your placenta will be removed from your uterus.  Your incisions will be closed with stitches (sutures). Staples, skin glue, or adhesive strips may also be applied to the incision in your abdomen.  Bandages (dressings) may be placed over the incision in your abdomen. The procedure may vary among health care providers and hospitals. What happens after the procedure?  Your blood pressure, heart rate, breathing rate, and blood oxygen level will be monitored until you are discharged from the hospital.  You may continue to receive fluids and medicines through an IV.  You will have some pain. Medicines will be available to help control your pain.  To help prevent blood clots: ? You may be given medicines. ? You may have to wear compression stockings or devices. ? You will be encouraged to walk around when you are able.  Hospital staff will encourage and support bonding with your baby. Your hospital may have you and your baby to stay in the same room (rooming in) during your hospital stay to encourage successful bonding and breastfeeding.  You may be encouraged to cough and breathe deeply often. This helps to prevent lung problems.  If you have a catheter draining your urine, it will be removed as soon as possible after your procedure. Summary  Cesarean birth, or cesarean delivery, is the surgical delivery of a baby through an incision in the abdomen and the uterus.  Follow instructions from your health care provider about eating or drinking  restrictions before the procedure.  You will have some pain after the procedure. Medicines will be available to help control your pain.  Hospital staff will encourage and support bonding with your baby after the procedure. Your hospital may have you and your baby to stay in the same room (rooming in) during your hospital stay to encourage successful bonding and breastfeeding. This information is not intended to replace advice given to you by your health care provider. Make sure you discuss any questions you have with your health care provider. Document Released: 07/13/2005 Document Revised: 01/17/2018 Document Reviewed: 01/17/2018 Elsevier Patient Education  2020 [employer]  

## 2019-03-10 ENCOUNTER — Telehealth: Payer: Self-pay | Admitting: Family Medicine

## 2019-03-10 ENCOUNTER — Telehealth: Payer: Self-pay | Admitting: Obstetrics & Gynecology

## 2019-03-10 NOTE — Telephone Encounter (Signed)
Patient is aware of H&P at [facility] on 04/13/19 @ 10:20am w/ Dr. Kenton Kingfisher, Pre-admit testing to be scheduled, Covid testing on 04/14/19 @ 9-10am, and OR on 04/18/19. Patient is aware she will be asked to quarantine after covid testing.

## 2019-03-10 NOTE — Telephone Encounter (Signed)
-----   Message from Gae Dry, MD sent at 03/09/2019  3:00 PM EDT ----- Regarding: Surgery Surgery Booking Request Patient Full Name:  Elizabeth Shea  MRN: [id number]  DOB: Jul 16, 1991  Surgeon: Hoyt Koch, MD  Requested Surgery Date and Time: 04/18/2019 Primary Diagnosis AND Code: Prior h/o 3 Cesrean sections planned at 39 weeks term pregnancy Desire for sterility (papers signed and in Epic) Secondary Diagnosis and Code: Z98.891, Z30.09 Surgical Procedure: Cesarean section with tubal ligation L&D Notification: Yes Admission Status: surgery admit Length of Surgery: 1 hr Special Case Needs: no H&P: yes (date) Phone Interview???: yes Interpreter: Language:  Medical Clearance: no Special Scheduling Instructions: no Acuity: P2

## 2019-03-17 ENCOUNTER — Other Ambulatory Visit: Payer: Self-pay

## 2019-03-17 ENCOUNTER — Ambulatory Visit: Payer: Medicaid Other

## 2019-03-17 NOTE — Telephone Encounter (Signed)
Patient has appointment scheduled for 03/17/2019.Jenetta Downer, RN

## 2019-03-23 ENCOUNTER — Ambulatory Visit: Payer: Medicaid Other

## 2019-03-24 ENCOUNTER — Telehealth: Payer: Self-pay | Admitting: Family Medicine

## 2019-03-24 ENCOUNTER — Ambulatory Visit: Payer: Medicaid Other

## 2019-03-24 ENCOUNTER — Other Ambulatory Visit: Payer: Self-pay

## 2019-03-24 NOTE — Telephone Encounter (Signed)
Returned call-c/o "bad cramping" in low abd. Since yesterday; lasting ~5 min. And having about 2-3x/ hr.; denies bleeding/ROM but is having whitish discharge without itching/odor/ has not been able to eat; has only felt FM x 1 today while driving; due to clinic closing-advised to go to hospital for eval.; f/u appt. Given for  03/28/19; consulted E. Sciora, CNM [name], RN

## 2019-03-24 NOTE — Telephone Encounter (Signed)
Patient wants to talk to nurse about having pains, some discharge and not feeling good.

## 2019-03-24 NOTE — Progress Notes (Unsigned)
When went to waiting area to call client-not here; per clerical, pt. Left [name], RN

## 2019-03-28 ENCOUNTER — Encounter: Payer: Self-pay | Admitting: Advanced Practice Midwife

## 2019-03-28 ENCOUNTER — Other Ambulatory Visit: Payer: Self-pay

## 2019-03-28 ENCOUNTER — Ambulatory Visit: Payer: Medicaid Other | Admitting: Advanced Practice Midwife

## 2019-03-28 VITALS — BP 110/77 | Temp 98.0°F | Wt 207.0 lb

## 2019-03-28 DIAGNOSIS — O0990 Supervision of high risk pregnancy, unspecified, unspecified trimester: Secondary | ICD-10-CM

## 2019-03-28 DIAGNOSIS — O99330 Smoking (tobacco) complicating pregnancy, unspecified trimester: Secondary | ICD-10-CM

## 2019-03-28 DIAGNOSIS — F1110 Opioid abuse, uncomplicated: Secondary | ICD-10-CM

## 2019-03-28 DIAGNOSIS — F1290 Cannabis use, unspecified, uncomplicated: Secondary | ICD-10-CM

## 2019-03-28 DIAGNOSIS — F909 Attention-deficit hyperactivity disorder, unspecified type: Secondary | ICD-10-CM

## 2019-03-28 DIAGNOSIS — O34219 Maternal care for unspecified type scar from previous cesarean delivery: Secondary | ICD-10-CM

## 2019-03-28 LAB — URINALYSIS
Bilirubin, UA: POSITIVE — AB
Glucose, UA: NEGATIVE
Ketones, UA: NEGATIVE
Leukocytes,UA: NEGATIVE
Nitrite, UA: NEGATIVE
Protein,UA: NEGATIVE
RBC, UA: NEGATIVE
Specific Gravity, UA: 1.02 (ref 1.005–1.030)
Urobilinogen, Ur: 2 mg/dL — ABNORMAL HIGH (ref 0.2–1.0)
pH, UA: 7 (ref 5.0–7.5)

## 2019-03-28 LAB — WET PREP FOR TRICH, YEAST, CLUE
Trichomonas Exam: NEGATIVE
Yeast Exam: NEGATIVE

## 2019-03-28 NOTE — Progress Notes (Signed)
In for visit; reports did not go to hospital the other day-laid down and felt better; c/o discharge so requesting provider collect cultures [name], RN

## 2019-03-28 NOTE — Addendum Note (Signed)
Addended by: Donnal Moat on: 03/28/2019 02:29 PM   Modules accepted: Orders

## 2019-03-28 NOTE — Progress Notes (Addendum)
   PRENATAL VISIT NOTE  Subjective:  Elizabeth Shea is a 28 y.o. G5P0 at [redacted]w[redacted]d being seen today for ongoing prenatal care.  She is currently monitored for the following issues for this high-risk pregnancy and has Opiate abuse, episodic ([place]); Cannabis abuse; Family history of autism; History of cesarean delivery affecting pregnancy; Previous preterm labor affecting pregnancy, antepartum; Tobacco use affecting pregnancy, antepartum; Supervision of high risk pregnancy, multigravida, antepartum; Obesity, unspecified; Hypertension; Bipolar disorder, unspecified ([place]); Attention deficit hyperactivity disorder; Hx of self-harm; Family history of seizures; and Marijuana use on their problem list.  Patient reports increased vaginal discharge.  Contractions: Not present. Vag. Bleeding: None.  Movement: Present. Denies leaking of fluid/ROM.   The following portions of the patient's history were reviewed and updated as appropriate: allergies, current medications, past family history, past medical history, past social history, past surgical history and problem list. Problem list updated.  Objective:   Vitals:   03/28/19 1344  BP: 110/77  Temp: 98 F (36.7 C)  Weight: 207 lb (93.9 kg)    Fetal Status: Fetal Heart Rate (bpm): 140 Fundal Height: 38 cm Movement: Present  Presentation: Complete Breech  General:  Alert, oriented and cooperative. Patient is in no acute distress.  Skin: Skin is warm and dry. No rash noted.   Cardiovascular: Normal heart rate noted  Respiratory: Normal respiratory effort, no problems with respiration noted  Abdomen: Soft, gravid, appropriate for gestational age.  Pain/Pressure: Present     Pelvic: Cervical exam deferred        Extremities: Normal range of motion.  Edema: Trace  Mental Status: Normal mood and affect. Normal behavior. Normal judgment and thought content.   Assessment and Plan:  Pregnancy: G5P0 at [redacted]w[redacted]d  1. Attention deficit hyperactivity disorder (ADHD),  unspecified ADHD type Not taking any medication  2. History of cesarean delivery affecting pregnancy Has c/s scheduled with BTL on 04/18/19  3. Supervision of high risk pregnancy, antepartum Taking Suboxone 8 mg tabs BID daily prescribed by [place].  Last MJ yesterday and uses 3-4x/wk.  Knows when to go to L&D.  Has car seat and ready for baby at home.  Agrees to UDS today.  Wet mount done with small amt white leukorrhea, ph<4.5. Treat wet mount per standing orders - Culture, beta strep (group b only) - Chlamydia/GC NAA, Confirmation - Urinalysis (Urine Dip) - [id number] Drug Screen  4. Tobacco use affecting pregnancy, antepartum Smoking 1/2 ppd--counseled via 5 A's to stop smoking   Preterm labor symptoms and general obstetric precautions including but not limited to vaginal bleeding, contractions, leaking of fluid and fetal movement were reviewed in detail with the patient. Please refer to After Visit Summary for other counseling recommendations.  Return in about 1 week (around 04/04/2019) for routine PNC.  Future Appointments  Date Time Provider [place]  04/12/2019  9:00 AM [facility] None  04/13/2019 10:20 AM Gae Dry, MD [facility] None    Herbie Saxon, CNM

## 2019-03-30 LAB — CANNABINOID CONFIRMATION, UR
CANNABINOIDS: POSITIVE — AB
Carboxy THC GC/MS Conf: >400 ng/mL

## 2019-03-30 LAB — 789231 7+OXYCODONE-BUND
Amphetamines, Urine: NEGATIVE ng/mL
BENZODIAZ UR QL: NEGATIVE ng/mL
Barbiturate screen, urine: NEGATIVE ng/mL
Cocaine (Metab.): NEGATIVE ng/mL
OPIATE SCREEN URINE: NEGATIVE ng/mL
Oxycodone/Oxymorphone, Urine: NEGATIVE ng/mL
PCP Quant, Ur: NEGATIVE ng/mL

## 2019-03-31 LAB — CHLAMYDIA/GC NAA, CONFIRMATION
Chlamydia trachomatis, NAA: NEGATIVE
Neisseria gonorrhoeae, NAA: NEGATIVE

## 2019-04-01 LAB — CULTURE, BETA STREP (GROUP B ONLY): Strep Gp B Culture: NEGATIVE

## 2019-04-10 ENCOUNTER — Other Ambulatory Visit: Payer: Self-pay

## 2019-04-10 ENCOUNTER — Ambulatory Visit: Payer: Medicaid Other | Admitting: Advanced Practice Midwife

## 2019-04-10 VITALS — BP 112/81 | Temp 97.4°F | Wt 209.0 lb

## 2019-04-10 DIAGNOSIS — O99330 Smoking (tobacco) complicating pregnancy, unspecified trimester: Secondary | ICD-10-CM

## 2019-04-10 DIAGNOSIS — O0990 Supervision of high risk pregnancy, unspecified, unspecified trimester: Secondary | ICD-10-CM

## 2019-04-10 DIAGNOSIS — O34219 Maternal care for unspecified type scar from previous cesarean delivery: Secondary | ICD-10-CM

## 2019-04-10 DIAGNOSIS — Z8489 Family history of other specified conditions: Secondary | ICD-10-CM

## 2019-04-10 DIAGNOSIS — F1210 Cannabis abuse, uncomplicated: Secondary | ICD-10-CM

## 2019-04-10 LAB — URINALYSIS
Bilirubin, UA: NEGATIVE
Glucose, UA: NEGATIVE
Ketones, UA: NEGATIVE
Leukocytes,UA: NEGATIVE
Nitrite, UA: NEGATIVE
Protein,UA: NEGATIVE
RBC, UA: NEGATIVE
Specific Gravity, UA: 1.02 (ref 1.005–1.030)
Urobilinogen, Ur: 1 mg/dL (ref 0.2–1.0)
pH, UA: 7 (ref 5.0–7.5)

## 2019-04-10 NOTE — Progress Notes (Signed)
   PRENATAL VISIT NOTE  Subjective:  Elizabeth Shea is a 28 y.o. G5P0 at 68w1dbeing seen today for ongoing prenatal care.  She is currently monitored for the following issues for this high-risk pregnancy and has Opiate abuse, episodic ([place]; Cannabis abuse; Family history of autism; History of cesarean delivery affecting pregnancy; Previous preterm labor affecting pregnancy, antepartum; Tobacco use affecting pregnancy, antepartum; Supervision of high risk pregnancy, multigravida, antepartum; Obesity, unspecified; Hypertension; Bipolar disorder, unspecified ([place]; Attention deficit hyperactivity disorder; Hx of self-harm; Family history of seizures; and Marijuana use on their problem list.  Patient reports no complaints.  Contractions: Not present. Vag. Bleeding: None.  Movement: Present. Denies leaking of fluid/ROM.   The following portions of the patient's history were reviewed and updated as appropriate: allergies, current medications, past family history, past medical history, past social history, past surgical history and problem list. Problem list updated.  Objective:   Vitals:   04/10/19 1513  BP: 112/81  Temp: (!) 97.4 F (36.3 C)  Weight: 209 lb (94.8 kg)    Fetal Status: Fetal Heart Rate (bpm): 130 Fundal Height: 39 cm Movement: Present  Presentation: Complete Breech  General:  Alert, oriented and cooperative. Patient is in no acute distress.  Skin: Skin is warm and dry. No rash noted.   Cardiovascular: Normal heart rate noted  Respiratory: Normal respiratory effort, no problems with respiration noted  Abdomen: Soft, gravid, appropriate for gestational age.  Pain/Pressure: Absent     Pelvic: Cervical exam deferred        Extremities: Normal range of motion.  Edema: Trace  Mental Status: Normal mood and affect. Normal behavior. Normal judgment and thought content.   Assessment and Plan:  Pregnancy: G5P0 at 322w1d  1.. History of cesarean delivery affecting pregnancy C/S  with BTL 04/18/19  3. Tobacco use affecting pregnancy, antepartum Smoking 1/2-1 ppd 4. Supervision of high risk pregnancy, antepartum Has not had 1 hr glucola this pregnancy yet because pt claims N&V.  9 lb (4.082 kg) Pt agrees to come tomorrow for 1 hr glucola.  Random glucose and HgbA1C today.  Taking Suboxone 8 mg BID given by [employer] Needs Covid testing 04/12/19. Met with R. ToKarrie Doffingoday - Urinalysis (Urine Dip) - Hgb A1c w/o eAG - Glucose, random  5. Cannabis abuse Last use 04/08/19--counseled not to use - - [id number] Screen   Term labor symptoms and general obstetric precautions including but not limited to vaginal bleeding, contractions, leaking of fluid and fetal movement were reviewed in detail with the patient. Please refer to After Visit Summary for other counseling recommendations.  No follow-ups on file.  Future Appointments  Date Time Provider [place]/16/2020  9:00 AM [facility] None  04/13/2019 10:20 AM HaGae DryMD [facility] None    ElHerbie SaxonCNM

## 2019-04-10 NOTE — Progress Notes (Signed)
In for visit; reports n/v improved but declines 1 Hr today-states will make appt for lab; denies hospital visits [name], RN

## 2019-04-11 ENCOUNTER — Other Ambulatory Visit: Payer: Medicaid Other

## 2019-04-11 LAB — GLUCOSE, RANDOM: Glucose: 65 mg/dL (ref 65–99)

## 2019-04-11 LAB — HGB A1C W/O EAG: Hgb A1c MFr Bld: 5.3 % (ref 4.8–5.6)

## 2019-04-12 ENCOUNTER — Telehealth: Payer: Self-pay | Admitting: Family Medicine

## 2019-04-12 ENCOUNTER — Inpatient Hospital Stay: Admission: RE | Admit: 2019-04-12 | Payer: Medicaid Other | Source: Ambulatory Visit

## 2019-04-12 NOTE — Telephone Encounter (Signed)
missed appt, per patient, she will call us back to reschedule when she knows when she can come

## 2019-04-13 ENCOUNTER — Ambulatory Visit (INDEPENDENT_AMBULATORY_CARE_PROVIDER_SITE_OTHER): Payer: Medicaid Other | Admitting: Obstetrics & Gynecology

## 2019-04-13 ENCOUNTER — Encounter
Admission: RE | Admit: 2019-04-13 | Discharge: 2019-04-13 | Disposition: A | Discharge status: Home / Self Care | Payer: Medicaid Other | Source: Ambulatory Visit | Attending: Obstetrics & Gynecology | Admitting: Obstetrics & Gynecology

## 2019-04-13 ENCOUNTER — Other Ambulatory Visit: Payer: Self-pay

## 2019-04-13 ENCOUNTER — Encounter: Payer: Self-pay | Admitting: Obstetrics & Gynecology

## 2019-04-13 VITALS — BP 118/80 | Ht 63.0 in | Wt 212.0 lb

## 2019-04-13 DIAGNOSIS — Z20828 Contact with and (suspected) exposure to other viral communicable diseases: Secondary | ICD-10-CM | POA: Insufficient documentation

## 2019-04-13 DIAGNOSIS — Z01812 Encounter for preprocedural laboratory examination: Secondary | ICD-10-CM | POA: Insufficient documentation

## 2019-04-13 DIAGNOSIS — O99330 Smoking (tobacco) complicating pregnancy, unspecified trimester: Secondary | ICD-10-CM

## 2019-04-13 DIAGNOSIS — O99333 Smoking (tobacco) complicating pregnancy, third trimester: Secondary | ICD-10-CM | POA: Diagnosis not present

## 2019-04-13 DIAGNOSIS — F1110 Opioid abuse, uncomplicated: Secondary | ICD-10-CM

## 2019-04-13 DIAGNOSIS — O34219 Maternal care for unspecified type scar from previous cesarean delivery: Secondary | ICD-10-CM | POA: Diagnosis not present

## 2019-04-13 DIAGNOSIS — Z3009 Encounter for other general counseling and advice on contraception: Secondary | ICD-10-CM

## 2019-04-13 DIAGNOSIS — Z98891 History of uterine scar from previous surgery: Secondary | ICD-10-CM

## 2019-04-13 HISTORY — DX: Panic disorder (episodic paroxysmal anxiety): F41.0

## 2019-04-13 HISTORY — DX: Gastro-esophageal reflux disease without esophagitis: K21.9

## 2019-04-13 HISTORY — DX: Anemia, unspecified: D64.9

## 2019-04-13 HISTORY — DX: Personal history of urinary calculi: Z87.442

## 2019-04-13 LAB — 789231 7+OXYCODONE-BUND
Amphetamines, Urine: NEGATIVE ng/mL
BENZODIAZ UR QL: NEGATIVE ng/mL
Barbiturate screen, urine: NEGATIVE ng/mL
Cocaine (Metab.): NEGATIVE ng/mL
OPIATE SCREEN URINE: NEGATIVE ng/mL
Oxycodone/Oxymorphone, Urine: NEGATIVE ng/mL
PCP Quant, Ur: NEGATIVE ng/mL

## 2019-04-13 LAB — CANNABINOID CONFIRMATION, UR
CANNABINOIDS: POSITIVE — AB
Carboxy THC GC/MS Conf: >400 ng/mL

## 2019-04-13 NOTE — H&P (View-Only) (Signed)
PRE-OPERATIVE HISTORY AND PHYSICAL EXAM  HPI:  Elizabeth Shea is a 28 y.o. G5P0 Patient's last menstrual period was 07/17/2018.; she is being admitted for surgery related to prior 3 cesarean sections, last one 8 years ago.  She has been doing well this pregnancy, complicated by opiod abuse currently treated with Suboxone 8mg  BID thru [facility]; also has been [facility] without ability to quit[facility]  PMHx: Past Medical History:  Diagnosis Date  . Asthma   . Hypertension   . Kidney stones    Past Surgical History:  Procedure Laterality Date  . CESAREAN SECTION     3  . CHOLECYSTECTOMY     Family History  Problem Relation Age of Onset  . Seizures Mother   . Hypertension Mother   . Hypertension Father   . Cancer Paternal Grandmother        Breast   . Diabetes Paternal Grandmother   . Autism Son    Social History   Tobacco Use  . Smoking status: Current Every Day Smoker    Packs/day: 0.50    Years: 8.00    Pack years: 4.00  . Smokeless tobacco: Never Used  Substance Use Topics  . Alcohol use: Never    Frequency: Never  . Drug use: Yes    Comment: Subutex    Current Outpatient Medications:  .  buprenorphine (SUBUTEX) 8 MG SUBL SL tablet, Place 8 mg under the tongue 2 (two) times daily. , Disp: , Rfl:  Allergies: Tramadol  Review of Systems  Constitutional: Negative for chills, fever and malaise/fatigue.  HENT: Negative for congestion, sinus pain and sore throat.   Eyes: Negative for blurred vision and pain.  Respiratory: Negative for cough and wheezing.   Cardiovascular: Negative for chest pain and leg swelling.  Gastrointestinal: Negative for abdominal pain, constipation, diarrhea, heartburn, nausea and vomiting.  Genitourinary: Negative for dysuria, frequency, hematuria and urgency.  Musculoskeletal: Negative for back pain, joint pain, myalgias and neck pain.  Skin: Negative for itching and rash.  Neurological: Negative for dizziness, tremors and weakness.   Endo/Heme/Allergies: Does not bruise/bleed easily.  Psychiatric/Behavioral: Negative for depression. The patient is not nervous/anxious and does not have insomnia.     Objective: BP 118/80   Ht 5\' 3"  (1.6 m)   Wt 212 lb (96.2 kg)   LMP 07/17/2018   BMI 37.55 kg/m   Filed Weights   04/13/19 0959  Weight: 212 lb (96.2 kg)   Physical Exam Constitutional:      General: She is not in acute distress.    Appearance: She is well-developed.  HENT:     Head: Normocephalic and atraumatic. No laceration.     Right Ear: Hearing normal.     Left Ear: Hearing normal.     Mouth/Throat:     Pharynx: Uvula midline.  Eyes:     Pupils: Pupils are equal, round, and reactive to light.  Neck:     Musculoskeletal: Normal range of motion and neck supple.     Thyroid: No thyromegaly.  Cardiovascular:     Rate and Rhythm: Normal rate and regular rhythm.     Heart sounds: No murmur. No friction rub. No gallop.   Pulmonary:     Effort: Pulmonary effort is normal. No respiratory distress.     Breath sounds: Normal breath sounds. No wheezing.  Chest:     Breasts:        Right: No mass, skin change or tenderness.  Left: No mass, skin change or tenderness.  Abdominal:     General: Bowel sounds are normal. There is no distension.     Palpations: Abdomen is soft.     Tenderness: There is no abdominal tenderness. There is no rebound.  Musculoskeletal: Normal range of motion.  Neurological:     Mental Status: She is alert and oriented to person, place, and time.     Cranial Nerves: No cranial nerve deficit.  Skin:    General: Skin is warm and dry.  Psychiatric:        Judgment: Judgment normal.  Vitals signs reviewed.    Assessment: 1. History of 3 cesarean sections   2. Opiate abuse, episodic ([place])   3. Tobacco use affecting pregnancy, antepartum   4. Sterilization consult   Desires repeat CS and BTL as well.  Risks of Suboxone on patient and newborn discussed, precautions and  pediatric interventions addressed.  Also, she plans to breast feed and this is discussed in this relation, in fact advised.  The patient has been fully informed about all methods of contraception, both temporary and permanent. She understands that tubal ligation is meant to be permanent, absolute and irreversible. She was told that there is an approximately 1 in 400 chance of a pregnancy in the future after tubal ligation. She was told the short and long term complications of tubal ligation. She understands the risks from this surgery include, but are not limited to, the risks of anesthesia, hemorrhage, infection, perforation, and injury to adjacent structures, bowel, bladder and blood vessels.   The risks of cesarean section discussed with the patient included but were not limited to: bleeding which may require transfusion or reoperation; infection which may require antibiotics; injury to bowel, bladder, ureters or other surrounding organs; injury to the fetus; need for additional procedures including hysterectomy in the event of a life-threatening hemorrhage; placental abnormalities wth subsequent pregnancies, incisional problems, thromboembolic phenomenon and other postoperative/anesthesia complications. The patient concurred with the proposed plan, giving informed written consent for the procedure.   Barnett Applebaum, MD, [facility], [facility] 04/13/2019  10:38 AM

## 2019-04-13 NOTE — Progress Notes (Signed)
   PRE-OPERATIVE HISTORY AND PHYSICAL EXAM  HPI:  Elizabeth Shea is a 28 y.o. G5P0 Patient's last menstrual period was 07/17/2018.; she is being admitted for surgery related to prior 3 cesarean sections, last one 8 years ago.  She has been doing well this pregnancy, complicated by opiod abuse currently treated with Suboxone 8mg BID thru [facility]; also has been smoking without ability to quit; [facility] at [facility].  PMHx: Past Medical History:  Diagnosis Date  . Asthma   . Hypertension   . Kidney stones    Past Surgical History:  Procedure Laterality Date  . CESAREAN SECTION     3  . CHOLECYSTECTOMY     Family History  Problem Relation Age of Onset  . Seizures Mother   . Hypertension Mother   . Hypertension Father   . Cancer Paternal Grandmother        Breast   . Diabetes Paternal Grandmother   . Autism Son    Social History   Tobacco Use  . Smoking status: Current Every Day Smoker    Packs/day: 0.50    Years: 8.00    Pack years: 4.00  . Smokeless tobacco: Never Used  Substance Use Topics  . Alcohol use: Never    Frequency: Never  . Drug use: Yes    Comment: Subutex    Current Outpatient Medications:  .  buprenorphine (SUBUTEX) 8 MG SUBL SL tablet, Place 8 mg under the tongue 2 (two) times daily. , Disp: , Rfl:  Allergies: Tramadol  Review of Systems  Constitutional: Negative for chills, fever and malaise/fatigue.  HENT: Negative for congestion, sinus pain and sore throat.   Eyes: Negative for blurred vision and pain.  Respiratory: Negative for cough and wheezing.   Cardiovascular: Negative for chest pain and leg swelling.  Gastrointestinal: Negative for abdominal pain, constipation, diarrhea, heartburn, nausea and vomiting.  Genitourinary: Negative for dysuria, frequency, hematuria and urgency.  Musculoskeletal: Negative for back pain, joint pain, myalgias and neck pain.  Skin: Negative for itching and rash.  Neurological: Negative for dizziness, tremors and weakness.   Endo/Heme/Allergies: Does not bruise/bleed easily.  Psychiatric/Behavioral: Negative for depression. The patient is not nervous/anxious and does not have insomnia.     Objective: BP 118/80   Ht 5' 3" (1.6 m)   Wt 212 lb (96.2 kg)   LMP 07/17/2018   BMI 37.55 kg/m   Filed Weights   04/13/19 0959  Weight: 212 lb (96.2 kg)   Physical Exam Constitutional:      General: She is not in acute distress.    Appearance: She is well-developed.  HENT:     Head: Normocephalic and atraumatic. No laceration.     Right Ear: Hearing normal.     Left Ear: Hearing normal.     Mouth/Throat:     Pharynx: Uvula midline.  Eyes:     Pupils: Pupils are equal, round, and reactive to light.  Neck:     Musculoskeletal: Normal range of motion and neck supple.     Thyroid: No thyromegaly.  Cardiovascular:     Rate and Rhythm: Normal rate and regular rhythm.     Heart sounds: No murmur. No friction rub. No gallop.   Pulmonary:     Effort: Pulmonary effort is normal. No respiratory distress.     Breath sounds: Normal breath sounds. No wheezing.  Chest:     Breasts:        Right: No mass, skin change or tenderness.          Left: No mass, skin change or tenderness.  Abdominal:     General: Bowel sounds are normal. There is no distension.     Palpations: Abdomen is soft.     Tenderness: There is no abdominal tenderness. There is no rebound.  Musculoskeletal: Normal range of motion.  Neurological:     Mental Status: She is alert and oriented to person, place, and time.     Cranial Nerves: No cranial nerve deficit.  Skin:    General: Skin is warm and dry.  Psychiatric:        Judgment: Judgment normal.  Vitals signs reviewed.    Assessment: 1. History of 3 cesarean sections   2. Opiate abuse, episodic ([place])   3. Tobacco use affecting pregnancy, antepartum   4. Sterilization consult   Desires repeat CS and BTL as well.  Risks of Suboxone on patient and newborn discussed, precautions and  pediatric interventions addressed.  Also, she plans to breast feed and this is discussed in this relation, in fact advised.  The patient has been fully informed about all methods of contraception, both temporary and permanent. She understands that tubal ligation is meant to be permanent, absolute and irreversible. She was told that there is an approximately 1 in 400 chance of a pregnancy in the future after tubal ligation. She was told the short and long term complications of tubal ligation. She understands the risks from this surgery include, but are not limited to, the risks of anesthesia, hemorrhage, infection, perforation, and injury to adjacent structures, bowel, bladder and blood vessels.   The risks of cesarean section discussed with the patient included but were not limited to: bleeding which may require transfusion or reoperation; infection which may require antibiotics; injury to bowel, bladder, ureters or other surrounding organs; injury to the fetus; need for additional procedures including hysterectomy in the event of a life-threatening hemorrhage; placental abnormalities wth subsequent pregnancies, incisional problems, thromboembolic phenomenon and other postoperative/anesthesia complications. The patient concurred with the proposed plan, giving informed written consent for the procedure.   Barnett Applebaum, MD, [facility], [facility] 04/13/2019  10:38 AM

## 2019-04-13 NOTE — Patient Instructions (Signed)
Your procedure is scheduled on: 04-18-19 TUESDAY Report to [facility] FOR COVID SCREENING AND THEN PROCEED TO LABOR AND DELIVERY ON THE 3RD FLOOR-ARRIVE AT 7 AM  Remember: Instructions that are not followed completely may result in serious medical risk, up to and including death, or upon the discretion of your surgeon and anesthesiologist your surgery may need to be rescheduled.    _x___ 1. Do not eat food after midnight the night before your procedure. NO GUM OR CANDY AFTER MIDNIGHT.  You may drink clear liquids up to 2 hours before you are scheduled to arrive at the hospital for your procedure.  Do not drink clear liquids within 2 hours of your scheduled arrival to the hospital.  Clear liquids include  --Water or Apple juice without pulp  --Clear carbohydrate beverage such as ClearFast or Gatorade  --Black Coffee or Clear Tea (No milk, no creamers, do not add anything to the coffee or Tea   ____Ensure clear carbohydrate drink on the way to the hospital for bariatric patients  ____Ensure clear carbohydrate drink 3 hours before surgery.     __x__ 2. No Alcohol for 24 hours before or after surgery.   __x__3. No Smoking or e-cigarettes for 24 prior to surgery.  Do not use any chewable tobacco products for at least 6 hour prior to surgery   ____  4. Bring all medications with you on the day of surgery if instructed.    __x__ 5. Notify your doctor if there is any change in your medical condition     (cold, fever, infections).    x___6. On the morning of surgery brush your teeth with toothpaste and water.  You may rinse your mouth with mouth wash if you wish.  Do not swallow any toothpaste or mouthwash.   Do not wear jewelry, make-up, hairpins, clips or nail polish.  Do not wear lotions, powders, or perfumes. You may wear deodorant.  Do not shave 48 hours prior to surgery. Men may shave face and neck.  Do not bring valuables to the hospital.    [facility] any  belongings or valuables.               Contacts, dentures or bridgework may not be worn into surgery.  Leave your suitcase in the car. After surgery it may be brought to your room.  For patients admitted to the hospital, discharge time is determined by your treatment team.  _  Patients discharged the day of surgery will not be allowed to drive home.  You will need someone to drive you home and stay with you the night of your procedure.    Please read over the following fact sheets that you were given:   [facility]   _x___ Take anti-hypertensive listed below, cardiac, seizure, asthma, anti-reflux and psychiatric medicines. These include:  1. YOU MAY TAKE ZOFRAN THE MORNING OF SURGERY IF NEEDED WITH A SMALL SIP OF WATER  2.  3.  4.  5.  6.  ____Fleets enema or Magnesium Citrate as directed.   _x___ Use CHG Soap or sage wipes as directed on instruction sheet-AVOID NIPPLE AND PRIVATE AREA   ____ Use inhalers on the day of surgery and bring to hospital day of surgery  ____ Stop Metformin and Janumet 2 days prior to surgery.    ____ Take 1/2 of usual insulin dose the night before surgery and none on the morning surgery.   ____ Follow recommendations  from Cardiologist, Pulmonologist or PCP regarding stopping Aspirin, Coumadin, Plavix ,Eliquis, Effient, or Pradaxa, and Pletal.  X____Stop Anti-inflammatories such as Advil, Aleve, Ibuprofen, Motrin, Naproxen, Naprosyn, Goodies powders or aspirin products NOW-OK to take Tylenol    ____ Stop supplements until after surgery   ____ Bring C-Pap to the hospital.

## 2019-04-13 NOTE — Patient Instructions (Signed)
PRE ADMISSION TESTING For Covid, prior to procedure Friday 9:00-10:00 [facility] entrance (drive up)  Results in 48-72 hours You will not receive notification if test results are negative. If positive for Covid19, your provider will notify you by phone, with additional instructions.   Cesarean Delivery, Care After This sheet gives you information about how to care for yourself after your procedure. Your health care provider may also give you more specific instructions. If you have problems or questions, contact your health care provider. What can I expect after the procedure? After the procedure, it is common to have:  A small amount of blood or clear fluid coming from the incision.  Some redness, swelling, and pain in your incision area.  Some abdominal pain and soreness.  Vaginal bleeding (lochia). Even though you did not have a vaginal delivery, you will still have vaginal bleeding and discharge.  Pelvic cramps.  Fatigue. You may have pain, swelling, and discomfort in the tissue between your vagina and your anus (perineum) if:  Your C-section was unplanned, and you were allowed to labor and push.  An incision was made in the area (episiotomy) or the tissue tore during attempted vaginal delivery. Follow these instructions at home: Incision care   Follow instructions from your health care provider about how to take care of your incision. Make sure you: ? Wash your hands with soap and water before you change your bandage (dressing). If soap and water are not available, use hand sanitizer. ? If you have a dressing, change it or remove it as told by your health care provider. ? Leave stitches (sutures), skin staples, skin glue, or adhesive strips in place. These skin closures may need to stay in place for 2 weeks or longer. If adhesive strip edges start to loosen and curl up, you may trim the loose edges. Do not remove adhesive strips completely unless your health care  provider tells you to do that.  Check your incision area every day for signs of infection. Check for: ? More redness, swelling, or pain. ? More fluid or blood. ? Warmth. ? Pus or a bad smell.  Do not take baths, swim, or use a hot tub until your health care provider says it's okay. Ask your health care provider if you can take showers.  When you cough or sneeze, hug a pillow. This helps with pain and decreases the chance of your incision opening up (dehiscing). Do this until your incision heals. Medicines  Take over-the-counter and prescription medicines only as told by your health care provider.  If you were prescribed an antibiotic medicine, take it as told by your health care provider. Do not stop taking the antibiotic even if you start to feel better.  Do not drive or use heavy machinery while taking prescription pain medicine. Lifestyle  Do not drink alcohol. This is especially important if you are breastfeeding or taking pain medicine.  Do not use any products that contain nicotine or tobacco, such as cigarettes, e-cigarettes, and chewing tobacco. If you need help quitting, ask your health care provider. Eating and drinking  Drink at least 8 eight-ounce glasses of water every day unless told not to by your health care provider. If you breastfeed, you may need to drink even more water.  Eat high-fiber foods every day. These foods may help prevent or relieve constipation. High-fiber foods include: ? Whole grain cereals and breads. ? Brown rice. ? Beans. ? Fresh fruits and vegetables. Activity   If possible,  have someone help you care for your baby and help with household activities for at least a few days after you leave the hospital.  Return to your normal activities as told by your health care provider. Ask your health care provider what activities are safe for you.  Rest as much as possible. Try to rest or take a nap while your baby is sleeping.  Do not lift anything  that is heavier than 10 lbs (4.5 kg), or the limit that you were told, until your health care provider says that it is safe.  Talk with your health care provider about when you can engage in sexual activity. This may depend on your: ? Risk of infection. ? How fast you heal. ? Comfort and desire to engage in sexual activity. General instructions  Do not use tampons or douches until your health care provider approves.  Wear loose, comfortable clothing and a supportive and well-fitting bra.  Keep your perineum clean and dry. Wipe from front to back when you use the toilet.  If you pass a blood clot, save it and call your health care provider to discuss. Do not flush blood clots down the toilet before you get instructions from your health care provider.  Keep all follow-up visits for you and your baby as told by your health care provider. This is important. Contact a health care provider if:  You have: ? A fever. ? Bad-smelling vaginal discharge. ? Pus or a bad smell coming from your incision. ? Difficulty or pain when urinating. ? A sudden increase or decrease in the frequency of your bowel movements. ? More redness, swelling, or pain around your incision. ? More fluid or blood coming from your incision. ? A rash. ? Nausea. ? Little or no interest in activities you used to enjoy. ? Questions about caring for yourself or your baby.  Your incision feels warm to the touch.  Your breasts turn red or become painful or hard.  You feel unusually sad or worried.  You vomit.  You pass a blood clot from your vagina.  You urinate more than usual.  You are dizzy or light-headed. Get help right away if:  You have: ? Pain that does not go away or get better with medicine. ? Chest pain. ? Difficulty breathing. ? Blurred vision or spots in your vision. ? Thoughts about hurting yourself or your baby. ? New pain in your abdomen or in one of your legs. ? A severe headache.  You  faint.  You bleed from your vagina so much that you fill more than one sanitary pad in one hour. Bleeding should not be heavier than your heaviest period. Summary  After the procedure, it is common to have pain at your incision site, abdominal cramping, and slight bleeding from your vagina.  Check your incision area every day for signs of infection.  Tell your health care provider about any unusual symptoms.  Keep all follow-up visits for you and your baby as told by your health care provider. This information is not intended to replace advice given to you by your health care provider. Make sure you discuss any questions you have with your health care provider. Document Released: 04/04/2002 Document Revised: 01/19/2018 Document Reviewed: 01/19/2018 Elsevier Patient Education  2020 [employer].

## 2019-04-14 ENCOUNTER — Other Ambulatory Visit: Payer: Self-pay

## 2019-04-14 ENCOUNTER — Other Ambulatory Visit
Admission: RE | Admit: 2019-04-14 | Discharge: 2019-04-14 | Disposition: A | Discharge status: Home / Self Care | Payer: Medicaid Other | Source: Ambulatory Visit | Attending: Obstetrics & Gynecology | Admitting: Obstetrics & Gynecology

## 2019-04-14 DIAGNOSIS — Z01812 Encounter for preprocedural laboratory examination: Secondary | ICD-10-CM | POA: Diagnosis not present

## 2019-04-14 LAB — SARS CORONAVIRUS 2 BY RT PCR (HOSPITAL ORDER, PERFORMED IN ~~LOC~~ HOSPITAL LAB): SARS Coronavirus 2: NEGATIVE

## 2019-04-17 ENCOUNTER — Encounter: Payer: Self-pay | Admitting: Anesthesiology

## 2019-04-17 MED ORDER — SODIUM CHLORIDE 0.9 % IV SOLN
2.0000 g | INTRAVENOUS | Status: AC
  Administered 2019-04-18: 09:00:00 2 g via INTRAVENOUS
  Filled 2019-04-17: qty 2

## 2019-04-18 ENCOUNTER — Other Ambulatory Visit: Payer: Self-pay

## 2019-04-18 ENCOUNTER — Inpatient Hospital Stay
Admission: RE | Admit: 2019-04-18 | Discharge: 2019-04-21 | DRG: 784 | Disposition: A | Discharge status: Home / Self Care | Payer: Medicaid Other | Attending: Obstetrics & Gynecology | Admitting: Obstetrics & Gynecology

## 2019-04-18 ENCOUNTER — Inpatient Hospital Stay: Payer: Medicaid Other | Admitting: Anesthesiology

## 2019-04-18 ENCOUNTER — Encounter
Admission: RE | Disposition: A | Discharge status: Home / Self Care | Payer: Self-pay | Source: Home / Self Care | Attending: Obstetrics & Gynecology

## 2019-04-18 DIAGNOSIS — O34211 Maternal care for low transverse scar from previous cesarean delivery: Principal | ICD-10-CM | POA: Diagnosis present

## 2019-04-18 DIAGNOSIS — Z3A39 39 weeks gestation of pregnancy: Secondary | ICD-10-CM

## 2019-04-18 DIAGNOSIS — O0990 Supervision of high risk pregnancy, unspecified, unspecified trimester: Secondary | ICD-10-CM | POA: Diagnosis not present

## 2019-04-18 DIAGNOSIS — O99334 Smoking (tobacco) complicating childbirth: Secondary | ICD-10-CM | POA: Diagnosis present

## 2019-04-18 DIAGNOSIS — Z98891 History of uterine scar from previous surgery: Secondary | ICD-10-CM

## 2019-04-18 DIAGNOSIS — O99330 Smoking (tobacco) complicating pregnancy, unspecified trimester: Secondary | ICD-10-CM

## 2019-04-18 DIAGNOSIS — O99324 Drug use complicating childbirth: Secondary | ICD-10-CM | POA: Diagnosis present

## 2019-04-18 DIAGNOSIS — O9081 Anemia of the puerperium: Secondary | ICD-10-CM | POA: Diagnosis not present

## 2019-04-18 DIAGNOSIS — Z302 Encounter for sterilization: Secondary | ICD-10-CM | POA: Diagnosis not present

## 2019-04-18 DIAGNOSIS — O34219 Maternal care for unspecified type scar from previous cesarean delivery: Secondary | ICD-10-CM

## 2019-04-18 DIAGNOSIS — O09219 Supervision of pregnancy with history of pre-term labor, unspecified trimester: Secondary | ICD-10-CM

## 2019-04-18 DIAGNOSIS — Z8489 Family history of other specified conditions: Secondary | ICD-10-CM

## 2019-04-18 DIAGNOSIS — F1110 Opioid abuse, uncomplicated: Secondary | ICD-10-CM | POA: Diagnosis present

## 2019-04-18 DIAGNOSIS — F17210 Nicotine dependence, cigarettes, uncomplicated: Secondary | ICD-10-CM | POA: Diagnosis present

## 2019-04-18 DIAGNOSIS — D62 Acute posthemorrhagic anemia: Secondary | ICD-10-CM | POA: Diagnosis not present

## 2019-04-18 LAB — URINE DRUG SCREEN, QUALITATIVE (ARMC ONLY)
Amphetamines, Ur Screen: NOT DETECTED
Barbiturates, Ur Screen: NOT DETECTED
Benzodiazepine, Ur Scrn: NOT DETECTED
Cannabinoid 50 Ng, Ur ~~LOC~~: POSITIVE — AB
Cocaine Metabolite,Ur ~~LOC~~: NOT DETECTED
MDMA (Ecstasy)Ur Screen: NOT DETECTED
Methadone Scn, Ur: NOT DETECTED
Opiate, Ur Screen: NOT DETECTED
Phencyclidine (PCP) Ur S: NOT DETECTED
Tricyclic, Ur Screen: NOT DETECTED

## 2019-04-18 LAB — CBC
HCT: 35.9 % — ABNORMAL LOW (ref 36.0–46.0)
Hemoglobin: 11.7 g/dL — ABNORMAL LOW (ref 12.0–15.0)
MCH: 28.5 pg (ref 26.0–34.0)
MCHC: 32.6 g/dL (ref 30.0–36.0)
MCV: 87.6 fL (ref 80.0–100.0)
Platelets: 221 10*3/uL (ref 150–400)
RBC: 4.1 MIL/uL (ref 3.87–5.11)
RDW: 13.2 % (ref 11.5–15.5)
WBC: 11.6 10*3/uL — ABNORMAL HIGH (ref 4.0–10.5)
nRBC: 0 % (ref 0.0–0.2)

## 2019-04-18 LAB — TYPE AND SCREEN
ABO/RH(D): O POS
Antibody Screen: NEGATIVE

## 2019-04-18 SURGERY — Surgical Case
Anesthesia: Spinal | Laterality: Bilateral

## 2019-04-18 MED ORDER — BUPIVACAINE 0.25 % ON-Q PUMP DUAL CATH 400 ML
400.0000 mL | INJECTION | Status: DC
  Filled 2019-04-18: qty 400

## 2019-04-18 MED ORDER — MORPHINE SULFATE (PF) 0.5 MG/ML IJ SOLN
INTRAMUSCULAR | Status: DC | PRN
  Administered 2019-04-18: .2 mg via EPIDURAL
  Administered 2019-04-18: 2 mg via INTRAVENOUS

## 2019-04-18 MED ORDER — KETAMINE HCL 50 MG/ML IJ SOLN
INTRAMUSCULAR | Status: AC
  Filled 2019-04-18: qty 10

## 2019-04-18 MED ORDER — LACTATED RINGERS IV SOLN
Freq: Once | INTRAVENOUS | Status: AC
  Administered 2019-04-18: 07:00:00 via INTRAVENOUS

## 2019-04-18 MED ORDER — KETOROLAC TROMETHAMINE 30 MG/ML IJ SOLN: 30.0000 mg | Freq: Four times a day (QID) | INTRAMUSCULAR | Status: AC | PRN

## 2019-04-18 MED ORDER — OXYTOCIN 40 UNITS IN NORMAL SALINE INFUSION - SIMPLE MED
2.5000 [IU]/h | INTRAVENOUS | Status: AC
  Administered 2019-04-18 (×2): 2.5 [IU]/h via INTRAVENOUS
  Filled 2019-04-18: qty 1000

## 2019-04-18 MED ORDER — OXYTOCIN 40 UNITS IN NORMAL SALINE INFUSION - SIMPLE MED
INTRAVENOUS | Status: AC
  Filled 2019-04-18: qty 1000

## 2019-04-18 MED ORDER — BUPIVACAINE HCL (PF) 0.5 % IJ SOLN
INTRAMUSCULAR | Status: DC | PRN
  Administered 2019-04-18: 10 mL

## 2019-04-18 MED ORDER — SODIUM CHLORIDE 0.9 % IV SOLN
INTRAVENOUS | Status: DC | PRN
  Administered 2019-04-18: 09:00:00 25 ug/min via INTRAVENOUS

## 2019-04-18 MED ORDER — FENTANYL CITRATE (PF) 100 MCG/2ML IJ SOLN
INTRAMUSCULAR | Status: DC | PRN
  Administered 2019-04-18 (×4): 25 ug via INTRAVENOUS

## 2019-04-18 MED ORDER — DIPHENHYDRAMINE HCL 25 MG PO CAPS
25.0000 mg | ORAL_CAPSULE | Freq: Four times a day (QID) | ORAL | Status: DC | PRN
  Administered 2019-04-21: 25 mg via ORAL
  Filled 2019-04-18: qty 1

## 2019-04-18 MED ORDER — IBUPROFEN 800 MG PO TABS
800.0000 mg | ORAL_TABLET | Freq: Four times a day (QID) | ORAL | Status: DC
  Administered 2019-04-19 – 2019-04-21 (×7): 800 mg via ORAL
  Filled 2019-04-18 (×7): qty 1

## 2019-04-18 MED ORDER — HEMOSTATIC AGENTS (NO CHARGE) OPTIME
TOPICAL | Status: DC | PRN
  Administered 2019-04-18: 1 via TOPICAL

## 2019-04-18 MED ORDER — ZOLPIDEM TARTRATE 5 MG PO TABS: 5.0000 mg | ORAL_TABLET | Freq: Every evening | ORAL | Status: DC | PRN

## 2019-04-18 MED ORDER — LACTATED RINGERS IV SOLN: INTRAVENOUS | Status: DC

## 2019-04-18 MED ORDER — SIMETHICONE 80 MG PO CHEW: 80.0000 mg | CHEWABLE_TABLET | ORAL | Status: DC

## 2019-04-18 MED ORDER — MIDAZOLAM HCL 2 MG/2ML IJ SOLN
INTRAMUSCULAR | Status: AC
  Filled 2019-04-18: qty 2

## 2019-04-18 MED ORDER — EPHEDRINE SULFATE 50 MG/ML IJ SOLN
INTRAMUSCULAR | Status: AC
  Filled 2019-04-18: qty 1

## 2019-04-18 MED ORDER — NICOTINE 7 MG/24HR TD PT24
7.0000 mg | MEDICATED_PATCH | Freq: Every day | TRANSDERMAL | Status: DC
  Administered 2019-04-18: 7 mg via TRANSDERMAL
  Filled 2019-04-18 (×4): qty 1

## 2019-04-18 MED ORDER — COCONUT OIL OIL
1.0000 "application " | TOPICAL_OIL | Status: DC | PRN
  Administered 2019-04-19: 1 via TOPICAL
  Filled 2019-04-18: qty 120

## 2019-04-18 MED ORDER — PHENYLEPHRINE 40 MCG/ML (10ML) SYRINGE FOR IV PUSH (FOR BLOOD PRESSURE SUPPORT)
PREFILLED_SYRINGE | INTRAVENOUS | Status: DC | PRN
  Administered 2019-04-18: 200 ug via INTRAVENOUS
  Administered 2019-04-18: 100 ug via INTRAVENOUS

## 2019-04-18 MED ORDER — DIBUCAINE (PERIANAL) 1 % EX OINT: 1.0000 "application " | TOPICAL_OINTMENT | CUTANEOUS | Status: DC | PRN

## 2019-04-18 MED ORDER — SENNOSIDES-DOCUSATE SODIUM 8.6-50 MG PO TABS
2.0000 | ORAL_TABLET | ORAL | Status: DC
  Administered 2019-04-19 – 2019-04-21 (×3): 2 via ORAL
  Filled 2019-04-18 (×3): qty 2

## 2019-04-18 MED ORDER — PROPOFOL 10 MG/ML IV BOLUS
INTRAVENOUS | Status: DC | PRN
  Administered 2019-04-18 (×4): 30 mg via INTRAVENOUS
  Administered 2019-04-18 (×2): 40 mg via INTRAVENOUS

## 2019-04-18 MED ORDER — PHENYLEPHRINE HCL (PRESSORS) 10 MG/ML IV SOLN
INTRAVENOUS | Status: AC
  Filled 2019-04-18: qty 1

## 2019-04-18 MED ORDER — FENTANYL CITRATE (PF) 100 MCG/2ML IJ SOLN
INTRAMUSCULAR | Status: AC
  Filled 2019-04-18: qty 2

## 2019-04-18 MED ORDER — PRENATAL MULTIVITAMIN CH
1.0000 | ORAL_TABLET | Freq: Every day | ORAL | Status: DC
  Administered 2019-04-18 – 2019-04-20 (×3): 1 via ORAL
  Filled 2019-04-18 (×3): qty 1

## 2019-04-18 MED ORDER — SODIUM CHLORIDE 0.9% FLUSH: 3.0000 mL | INTRAVENOUS | Status: DC | PRN

## 2019-04-18 MED ORDER — NALOXONE HCL 0.4 MG/ML IJ SOLN: 0.4000 mg | INTRAMUSCULAR | Status: DC | PRN

## 2019-04-18 MED ORDER — ACETAMINOPHEN 500 MG PO TABS
ORAL_TABLET | ORAL | Status: AC
  Filled 2019-04-18: qty 2

## 2019-04-18 MED ORDER — TETANUS-DIPHTH-ACELL PERTUSSIS 5-2.5-18.5 LF-MCG/0.5 IM SUSP: 0.5000 mL | Freq: Once | INTRAMUSCULAR | Status: DC

## 2019-04-18 MED ORDER — ONDANSETRON HCL 4 MG/2ML IJ SOLN: 4.0000 mg | Freq: Three times a day (TID) | INTRAMUSCULAR | Status: DC | PRN

## 2019-04-18 MED ORDER — ACETAMINOPHEN 325 MG PO TABS
650.0000 mg | ORAL_TABLET | ORAL | Status: DC | PRN
  Administered 2019-04-18: 650 mg via ORAL
  Filled 2019-04-18: qty 2

## 2019-04-18 MED ORDER — NALOXONE HCL 4 MG/10ML IJ SOLN
1.0000 ug/kg/h | INTRAVENOUS | Status: DC | PRN
  Filled 2019-04-18: qty 5

## 2019-04-18 MED ORDER — ONDANSETRON HCL 4 MG/2ML IJ SOLN
INTRAMUSCULAR | Status: AC
  Filled 2019-04-18: qty 2

## 2019-04-18 MED ORDER — MORPHINE SULFATE (PF) 0.5 MG/ML IJ SOLN
INTRAMUSCULAR | Status: AC
  Filled 2019-04-18: qty 10

## 2019-04-18 MED ORDER — DIPHENHYDRAMINE HCL 50 MG/ML IJ SOLN: 12.5000 mg | INTRAMUSCULAR | Status: DC | PRN

## 2019-04-18 MED ORDER — MIDAZOLAM HCL 2 MG/2ML IJ SOLN
INTRAMUSCULAR | Status: DC | PRN
  Administered 2019-04-18 (×2): 1 mg via INTRAVENOUS

## 2019-04-18 MED ORDER — LACTATED RINGERS IV SOLN
Freq: Once | INTRAVENOUS | Status: AC
  Administered 2019-04-18: 08:00:00 via INTRAVENOUS

## 2019-04-18 MED ORDER — OXYTOCIN 40 UNITS IN NORMAL SALINE INFUSION - SIMPLE MED
INTRAVENOUS | Status: DC | PRN
  Administered 2019-04-18: 1000 mL via INTRAVENOUS

## 2019-04-18 MED ORDER — MEPERIDINE HCL 25 MG/ML IJ SOLN: 6.2500 mg | INTRAMUSCULAR | Status: DC | PRN

## 2019-04-18 MED ORDER — SOD CITRATE-CITRIC ACID 500-334 MG/5ML PO SOLN
ORAL | Status: AC
  Filled 2019-04-18: qty 15

## 2019-04-18 MED ORDER — WITCH HAZEL-GLYCERIN EX PADS: 1.0000 "application " | MEDICATED_PAD | CUTANEOUS | Status: DC | PRN

## 2019-04-18 MED ORDER — SIMETHICONE 80 MG PO CHEW
80.0000 mg | CHEWABLE_TABLET | Freq: Three times a day (TID) | ORAL | Status: DC
  Administered 2019-04-18 – 2019-04-21 (×8): 80 mg via ORAL
  Filled 2019-04-18 (×8): qty 1

## 2019-04-18 MED ORDER — KETOROLAC TROMETHAMINE 30 MG/ML IJ SOLN
INTRAMUSCULAR | Status: AC
  Filled 2019-04-18: qty 1

## 2019-04-18 MED ORDER — ONDANSETRON HCL 4 MG/2ML IJ SOLN
INTRAMUSCULAR | Status: DC | PRN
  Administered 2019-04-18: 4 mg via INTRAVENOUS

## 2019-04-18 MED ORDER — ONDANSETRON HCL 4 MG/2ML IJ SOLN: 4.0000 mg | Freq: Once | INTRAMUSCULAR | Status: DC | PRN

## 2019-04-18 MED ORDER — EPHEDRINE SULFATE 50 MG/ML IJ SOLN
INTRAMUSCULAR | Status: DC | PRN
  Administered 2019-04-18: 5 mg via INTRAVENOUS

## 2019-04-18 MED ORDER — DIPHENHYDRAMINE HCL 25 MG PO CAPS: 25.0000 mg | ORAL_CAPSULE | ORAL | Status: DC | PRN

## 2019-04-18 MED ORDER — BUPRENORPHINE HCL-NALOXONE HCL 8-2 MG SL SUBL
1.0000 | SUBLINGUAL_TABLET | Freq: Two times a day (BID) | SUBLINGUAL | Status: DC
  Administered 2019-04-18 – 2019-04-21 (×7): 1 via SUBLINGUAL
  Filled 2019-04-18 (×5): qty 1

## 2019-04-18 MED ORDER — ACETAMINOPHEN 500 MG PO TABS
1000.0000 mg | ORAL_TABLET | Freq: Four times a day (QID) | ORAL | Status: AC
  Administered 2019-04-18 – 2019-04-19 (×3): 1000 mg via ORAL
  Filled 2019-04-18 (×2): qty 2

## 2019-04-18 MED ORDER — BUPIVACAINE HCL (PF) 0.5 % IJ SOLN
INTRAMUSCULAR | Status: AC
  Filled 2019-04-18: qty 30

## 2019-04-18 MED ORDER — GLYCOPYRROLATE 0.2 MG/ML IJ SOLN
INTRAMUSCULAR | Status: AC
  Filled 2019-04-18: qty 1

## 2019-04-18 MED ORDER — GLYCOPYRROLATE 0.2 MG/ML IJ SOLN
INTRAMUSCULAR | Status: DC | PRN
  Administered 2019-04-18: 0.2 mg via INTRAVENOUS

## 2019-04-18 MED ORDER — SIMETHICONE 80 MG PO CHEW
80.0000 mg | CHEWABLE_TABLET | ORAL | Status: DC | PRN
  Administered 2019-04-21: 80 mg via ORAL
  Filled 2019-04-18: qty 1

## 2019-04-18 MED ORDER — SOD CITRATE-CITRIC ACID 500-334 MG/5ML PO SOLN
30.0000 mL | ORAL | Status: AC
  Administered 2019-04-18: 30 mL via ORAL

## 2019-04-18 MED ORDER — FENTANYL CITRATE (PF) 100 MCG/2ML IJ SOLN
25.0000 ug | INTRAMUSCULAR | Status: DC | PRN
  Administered 2019-04-18 (×2): 25 ug via INTRAVENOUS
  Filled 2019-04-18: qty 2

## 2019-04-18 MED ORDER — KETOROLAC TROMETHAMINE 30 MG/ML IJ SOLN
30.0000 mg | Freq: Once | INTRAMUSCULAR | Status: DC
  Filled 2019-04-18: qty 1

## 2019-04-18 MED ORDER — BUPIVACAINE IN DEXTROSE 0.75-8.25 % IT SOLN
INTRATHECAL | Status: DC | PRN
  Administered 2019-04-18: 1.4 mL via INTRATHECAL

## 2019-04-18 MED ORDER — MENTHOL 3 MG MT LOZG
1.0000 | LOZENGE | OROMUCOSAL | Status: DC | PRN
  Filled 2019-04-18: qty 9

## 2019-04-18 MED ORDER — FENTANYL CITRATE (PF) 100 MCG/2ML IJ SOLN: 25.0000 ug | INTRAMUSCULAR | Status: DC | PRN

## 2019-04-18 MED ORDER — BUPIVACAINE HCL 0.5 % IJ SOLN
10.0000 mL | Freq: Once | INTRAMUSCULAR | Status: DC
  Filled 2019-04-18: qty 10

## 2019-04-18 MED ORDER — KETOROLAC TROMETHAMINE 30 MG/ML IJ SOLN
30.0000 mg | Freq: Four times a day (QID) | INTRAMUSCULAR | Status: AC
  Administered 2019-04-18 – 2019-04-19 (×3): 30 mg via INTRAVENOUS
  Filled 2019-04-18 (×3): qty 1

## 2019-04-18 MED ORDER — KETOROLAC TROMETHAMINE 30 MG/ML IJ SOLN
30.0000 mg | Freq: Four times a day (QID) | INTRAMUSCULAR | Status: AC | PRN
  Administered 2019-04-18 (×2): 30 mg via INTRAVENOUS

## 2019-04-18 SURGICAL SUPPLY — 26 items
BARRIER ADHS 3X4 INTERCEED (GAUZE/BANDAGES/DRESSINGS) ×3 IMPLANT
CANISTER SUCT 3000ML PPV (MISCELLANEOUS) ×3 IMPLANT
CATH KIT ON-Q SILVERSOAK 5IN (CATHETERS) ×6 IMPLANT
CHLORAPREP W/TINT 26 (MISCELLANEOUS) ×6 IMPLANT
COVER WAND RF STERILE (DRAPES) ×3 IMPLANT
DERMABOND ADVANCED (GAUZE/BANDAGES/DRESSINGS) ×2
DERMABOND ADVANCED .7 DNX12 (GAUZE/BANDAGES/DRESSINGS) ×1 IMPLANT
ELECT CAUTERY BLADE 6.4 (BLADE) ×3 IMPLANT
ELECT REM PT RETURN 9FT ADLT (ELECTROSURGICAL) ×3
ELECTRODE REM PT RTRN 9FT ADLT (ELECTROSURGICAL) ×1 IMPLANT
GLOVE SKINSENSE NS SZ8.0 LF (GLOVE) ×2
GLOVE SKINSENSE STRL SZ8.0 LF (GLOVE) ×1 IMPLANT
GOWN STRL REUS W/ TWL LRG LVL3 (GOWN DISPOSABLE) ×1 IMPLANT
GOWN STRL REUS W/ TWL XL LVL3 (GOWN DISPOSABLE) ×2 IMPLANT
GOWN STRL REUS W/TWL LRG LVL3 (GOWN DISPOSABLE) ×2
GOWN STRL REUS W/TWL XL LVL3 (GOWN DISPOSABLE) ×4
NS IRRIG 1000ML POUR BTL (IV SOLUTION) ×3 IMPLANT
PACK C SECTION AR (MISCELLANEOUS) ×3 IMPLANT
PAD OB MATERNITY 4.3X12.25 (PERSONAL CARE ITEMS) ×3 IMPLANT
PAD PREP 24X41 OB/GYN DISP (PERSONAL CARE ITEMS) ×3 IMPLANT
PENCIL SMOKE ULTRAEVAC 22 CON (MISCELLANEOUS) ×3 IMPLANT
SUT MAXON ABS #0 GS21 30IN (SUTURE) ×6 IMPLANT
SUT PLAIN 2 0 XLH (SUTURE) ×3 IMPLANT
SUT VIC AB 1 CT1 36 (SUTURE) ×9 IMPLANT
SUT VIC AB 2-0 CT1 36 (SUTURE) ×3 IMPLANT
SUT VIC AB 4-0 FS2 27 (SUTURE) ×3 IMPLANT

## 2019-04-18 NOTE — Op Note (Signed)
Cesarean Section Procedure Note Indications: prior cesarean section and term intrauterine pregnancy, Desire for permanent sterility  Pre-operative Diagnosis: Intrauterine pregnancy [redacted]w[redacted]d ;  prior cesarean section and term intrauterine pregnancy, Desire for permanent sterility Post-operative Diagnosis: same, delivered. Procedure: Low Transverse Cesarean Section, Bilateral Tubal Ligation Surgeon: Annamarie Major, MD Assistant(s): Bonna Gains, No other capable assistant available, in surgery requiring high level assistant. Anesthesia: Spinal anesthesia Estimated Blood Loss:500 Complications: None; patient tolerated the procedure well. Disposition: PACU - hemodynamically stable. Condition: stable  Findings: A female infant in the cephalic presentation. Amniotic fluid - Clear  Birth weight 9-2 lbs.  Apgars of 8 and 9.  Intact placenta with a three-vessel cord. Grossly normal uterus, tubes and ovaries bilaterally. Lower uterine segment thinning and some intraabdominal adhesions were noted.  Procedure Details   The patient was taken to Operating Room, identified as the correct patient and the procedure verified as C-Section Delivery. A Time Out was held and the above information confirmed. After induction of anesthesia, the patient was draped and prepped in the usual sterile manner. A Pfannenstiel incision was made and carried down through the subcutaneous tissue to the fascia. Fascial incision was made and extended transversely with the Mayo scissors. The fascia was separated from the underlying rectus tissue superiorly and inferiorly. The peritoneum was identified and entered bluntly. Peritoneal incision was extended longitudinally. The utero-vesical peritoneal reflection was incised transversely and a bladder flap was created digitally.  A low transverse hysterotomy was made. The fetus was delivered atraumatically. The umbilical cord was clamped x2 and cut and the infant was handed to the awaiting  pediatricians. The placenta was removed intact and appeared normal with a 3-vessel cord.  The uterus was exteriorized and cleared of all clot and debris. The hysterotomy was closed with running sutures of 0 Vicryl suture. A second imbricating layer was placed with the same suture. Excellent hemostasis was observed.   The left Fallopian tube was identified, grasped with the Babcock clamps, lifted to the skin incision and followed out distally to the fimbriae. An avascular midsection of the tube approximately 3-4cm from the cornua was grasped with the babcock clamps and brought into a knuckle at the skin incision. The tube was double ligated with 2-0 Vicryl suture and the intervening portion of tube was transected and removed. Excellent hemostasis was noted and the tube was returned to the abdomen. Attention was then turned to the right fallopian tube after confirmation of identification by tracing the tube out to the fimbriae. The same procedure was then performed on the right Fallopian tube. Again, excellent hemostasis was noted at the end of the procedure.  The uterus was returned to the abdomen. The pelvis was irrigated and again, excellent hemostasis was noted.  The On Q Pain pump System was then placed.  Trocars were placed through the abdominal wall into the subfascial space and these were used to thread the silver soaker cathaters into place.The rectus fascia was then reapproximated with running sutures of Maxon, with careful placement not to incorporate the cathaters. Subcutaneous tissues are then irrigated with saline and hemostasis assured.  Skin is then closed with 4-0 vicryl suture in a subcuticular fashion followed by skin adhesive. The cathaters are flushed each with 5 mL of Bupivicaine and stabilized into place with dressing. Instrument, sponge, and needle counts were correct prior to the abdominal closure and at the conclusion of the case.  The patient tolerated the procedure well and was  transferred to the recovery room in stable condition.  Barnett Applebaum, MD, [facility], [facility] 04/18/2019  9:46 AM

## 2019-04-18 NOTE — Lactation Note (Signed)
This note was copied from a baby's chart. Lactation Consultation Note  Patient Name: Elizabeth Shea Date: 04/18/2019 Reason for consult: Initial assessment;1st time breastfeeding(Transition request)  LC called in by transition RN to assist with first breastfeeding attempt. Mom has tested positive for marijuana and is currently taking suboxone. Transition RN has spoken with mom regarding the risks of breastfeeding with marijuana use.  Mom has previous children, but reports this is her first time making an effort to put baby to the breast.  Baby Elizabeth Shea was born at [facility], and has been skin to skin since mom recovered from c/s delivery. Elizabeth Shea was not displaying any hunger cues, and appeared content. LC did work with mom in getting baby into position in a position that she felt most comfortable; cross cradle-- other positions were offered but she declined. Elizabeth Shea was moved into position and support pillows added for additional support. Multiple attempts made to stimulate and elicit cues for latching, none achieved. Mom's right nipple did not appear to erect with stimulation, but left nipple did. Multiple attempts made on each side without success. Elizabeth Shea left in skin to skin position with mom and remained content. RN did perform glucose test due to Elizabeth Shea's size, result was 63.  LC taught mom hand expression, colostrum was evident. Mom educated on early cues, position and latch. LC will continue to follow-up with mom and baby Elizabeth Shea.  Maternal Data Has patient been taught Hand Expression?: Yes Does the patient have breastfeeding experience prior to this delivery?: Yes  Feeding    LATCH Score Latch: Too sleepy or reluctant, no latch achieved, no sucking elicited.  Audible Swallowing: None  Type of Nipple: Everted at rest and after stimulation  Comfort (Breast/Nipple): Soft / non-tender  Hold (Positioning): Full assist, staff holds infant at breast  LATCH Score:  4  Interventions Interventions: Breast feeding basics reviewed;Assisted with latch;Skin to skin;Hand express;Breast compression;Adjust position  Lactation Tools Discussed/Used     Consult Status Consult Status: Follow-up Date: 04/18/19 Follow-up type: [place] 04/18/2019, 11:30 AM

## 2019-04-18 NOTE — Lactation Note (Signed)
This note was copied from a baby's chart. Lactation Consultation Note  Patient Name: Elizabeth Shea CYELY'H Date: 04/18/2019 Reason for consult: Follow-up assessment  LC followed up with mom and Mason after being moved to [employer]. Mom reports a recent feeding of 5 minutes in duration. When asked how she felt mom reported that she believes pumping may be better for her. She felt uncomfortable and had feelings of anxiety when [employer].  She also stated that she felt that breast milk was good for him and wanted him to have it, and that she may continue working on latching him, but she didn't like the way she felt. LC provided reassurance and support for mom's decision. Discussed the effectiveness of Mason removing the colostrum, and even hand expression, instead of the pump for the first few days, and then possibly implementing a EBP afterwards. Mom agrees to keep trying to put him to the breast, but may start to hand express in the feeds to come, and provide via spoon or syringe. [facility] name and number was written on the whiteboard, mom encouraged to call out when Cornelia Copa begins to show early hunger cues.  Maternal Data Has patient been taught Hand Expression?: Yes Does the patient have breastfeeding experience prior to this delivery?: Yes  Feeding    LATCH Score                   Interventions Interventions: Breast feeding basics reviewed  Lactation Tools Discussed/Used     Consult Status Consult Status: Follow-up Date: 04/18/19 Follow-up type: [place] 04/18/2019, 3:12 PM

## 2019-04-18 NOTE — Interval H&P Note (Signed)
History and Physical Interval Note:  04/18/2019 7:14 AM  Elizabeth Shea  has presented today for surgery, with the diagnosis of PRIOR H/O 3 CESAREAN SECTIONS PLANNED AT 42 WKS TERM PREGNANCY. DESIRE FOR STERILITY.Marland Kitchen  The various methods of treatment have been discussed with the patient and family. After consideration of risks, benefits and other options for treatment, the patient has consented to  Procedure(s): Rheems (Bilateral) as a surgical intervention.  The patient's history has been reviewed, patient examined, no change in status, stable for surgery.  I have reviewed the patient's chart and labs.  Questions were answered to the patient's satisfaction.     Hoyt Koch

## 2019-04-18 NOTE — Anesthesia Post-op Follow-up Note (Signed)
Anesthesia QCDR form completed.        

## 2019-04-18 NOTE — Discharge Summary (Signed)
OB Discharge Summary     Patient Name: Elizabeth Shea DOB: Apr 12, 1991 MRN: [id number]  Date of admission: 04/18/2019 Delivering MD: Hoyt Koch, MD  Date of Delivery: 04/18/2019  Date of discharge: 04/21/2019  Admitting diagnosis: PRIOR H O 3 CESAREAN SECTIONS PLANNED AT 91 WKS TERM PREGNANCY. DESIRE FOR STERILITY. Intrauterine pregnancy: [redacted]w[redacted]d     Secondary diagnosis: Substance abuse, on Suboxone     Discharge diagnosis: Term Pregnancy Delivered, No other diagnosis                         Hospital course:  Sceduled C/S   28 y.o. yo [id number] at [redacted]w[redacted]d was admitted to the hospital 04/18/2019 for scheduled cesarean section with the following indication:Elective Repeat.  Membrane Rupture Time/Date: 8:55 AM ,04/18/2019   Patient delivered a Viable infant.04/18/2019  Details of operation can be found in separate operative note.  Pateint had an uncomplicated postpartum course.  She is ambulating, tolerating a regular diet, passing flatus, and urinating well. Patient is discharged from hospital care in stable condition on  04/21/19                                                                        Post partum procedures:postpartum tubal ligation  Complications: None  Physical exam on 04/21/2019: Vitals:   04/20/19 0820 04/20/19 1626 04/20/19 2317 04/21/19 0830  BP: 125/87 116/73 114/75 116/82  Pulse: 66 84 80 76  Resp: 20 20 20 18   Temp: 98.1 F (36.7 C) 98.4 F (36.9 C) 98.2 F (36.8 C) 97.6 F (36.4 C)  TempSrc: Oral Oral Oral Oral  SpO2: 98%  99% 99%  Weight:      Height:       General: alert, cooperative and no distress Lochia: appropriate Uterine Fundus: firm Incision: Healing well with no significant drainage, Dressing is clean, dry, and intact DVT Evaluation: No evidence of DVT seen on physical exam.  Labs: Lab Results  Component Value Date   WBC 13.7 (H) 04/19/2019   HGB 10.0 (L) 04/19/2019   HCT 30.4 (L) 04/19/2019   MCV 88.4 04/19/2019   PLT 185 04/19/2019    CMP Latest Ref Rng & Units 04/10/2019  Glucose 65 - 99 mg/dL 65  BUN 6 - 20 mg/dL -  Creatinine 0.44 - 1.00 mg/dL -  Sodium 135 - 145 mmol/L -  Potassium 3.5 - 5.1 mmol/L -  Chloride 98 - 111 mmol/L -  CO2 22 - 32 mmol/L -  Calcium 8.9 - 10.3 mg/dL -  Total Protein 6.5 - 8.1 g/dL -  Total Bilirubin 0.3 - 1.2 mg/dL -  Alkaline Phos 38 - 126 U/L -  AST 15 - 41 U/L -  ALT 0 - 44 U/L -    Discharge instruction: per After Visit Summary.  Medications:  Allergies as of 04/21/2019      Reactions   Tramadol Hives      Medication List    STOP taking these medications   ondansetron 4 MG tablet Commonly known as: ZOFRAN     TAKE these medications   buprenorphine 8 MG Subl SL tablet Commonly known as: SUBUTEX Place 8 mg under the tongue 2 (two) times daily.  Discharge Care Instructions  (From admission, onward)         Start     Ordered   04/21/19 0000  Discharge wound care:    Comments: Keep incision dry, clean.   04/21/19 1038          Diet: routine diet  Activity: Advance as tolerated. Pelvic rest for 6 weeks.   Outpatient follow up: Follow-up Information    Nadara Mustard, MD. Go in 1 week(s).   Specialty: Obstetrics and Gynecology Contact information: [address] [place] [address] [phone]             Postpartum contraception: Tubal Ligation Rhogam Given postpartum: no Rubella vaccine given postpartum: no Varicella vaccine given postpartum: no TDaP given antepartum or postpartum: Yes  Newborn Data: Live born female Mason Birth Weight: 9 lb 2 oz (4140 g) APGAR: 8, 9  Newborn Delivery   Birth date/time: 04/18/2019 08:56:00 Delivery type: C-Section, Low Transverse Trial of labor: No C-section categorization: Repeat       Baby Feeding: Breast  Disposition:Baby to stay for 5 day observation due to mother's use of Subutex Mother to stay in hospital with newborn  SIGNED:   Tresea Mall, CNM 04/21/2019  10:38 AM

## 2019-04-18 NOTE — Discharge Instructions (Signed)

## 2019-04-18 NOTE — Transfer of Care (Signed)
Immediate Anesthesia Transfer of Care Note  Patient: Elizabeth Shea  Procedure(s) Performed: CESAREAN SECTION WITH BILATERAL TUBAL LIGATION (Bilateral )  Patient Location: PACU and Mother/Baby  Anesthesia Type:Spinal  Level of Consciousness: awake and drowsy  Airway & Oxygen Therapy: Patient Spontanous Breathing and Patient connected to nasal cannula oxygen  Post-op Assessment: Report given to RN and Post -op Vital signs reviewed and stable  Post vital signs: stable  Last Vitals:  Vitals Value Taken Time  BP 113/49 04/18/19 0948  Temp    Pulse 77 04/18/19 0948  Resp 17 04/18/19 0947  SpO2 100 % 04/18/19 0947    Last Pain:  Vitals:   04/18/19 0947  TempSrc:   PainSc: 9          Complications: No apparent anesthesia complications

## 2019-04-18 NOTE — Anesthesia Preprocedure Evaluation (Signed)
Anesthesia Evaluation  Patient identified by MRN, date of birth, ID band Patient awake    Reviewed: Allergy & Precautions, NPO status , Patient's Chart, lab work & pertinent test results, reviewed documented beta blocker date and time   Airway Mallampati: III  TM Distance: >3 FB     Dental  (+) Chipped   Pulmonary asthma , Current Smoker,           Cardiovascular hypertension, Pt. on medications      Neuro/Psych PSYCHIATRIC DISORDERS Anxiety Bipolar Disorder    GI/Hepatic GERD  Controlled,  Endo/Other    Renal/GU      Musculoskeletal   Abdominal   Peds  Hematology  (+) anemia ,   Anesthesia Other Findings Obese. MJ, opiate use. Smokes. ADD. EKG 59yr ago ok.  Reproductive/Obstetrics                             Anesthesia Physical Anesthesia Plan  ASA: III  Anesthesia Plan: Spinal   Post-op Pain Management:    Induction: Intravenous  PONV Risk Score and Plan:   Airway Management Planned:   Additional Equipment:   Intra-op Plan:   Post-operative Plan:   Informed Consent: I have reviewed the patients History and Physical, chart, labs and discussed the procedure including the risks, benefits and alternatives for the proposed anesthesia with the patient or authorized representative who has indicated his/her understanding and acceptance.       Plan Discussed with: CRNA  Anesthesia Plan Comments:         Anesthesia Quick Evaluation

## 2019-04-19 ENCOUNTER — Encounter: Payer: Self-pay | Admitting: Obstetrics & Gynecology

## 2019-04-19 LAB — MEASLES/MUMPS/RUBELLA IMMUNITY
Mumps IgG: <9 AU/mL — ABNORMAL LOW (ref >10.9)
Rubella: 1.12 index (ref >0.99)
Rubeola IgG: 83.3 AU/mL (ref >16.4)

## 2019-04-19 LAB — CBC
HCT: 30.4 % — ABNORMAL LOW (ref 36.0–46.0)
Hemoglobin: 10 g/dL — ABNORMAL LOW (ref 12.0–15.0)
MCH: 29.1 pg (ref 26.0–34.0)
MCHC: 32.9 g/dL (ref 30.0–36.0)
MCV: 88.4 fL (ref 80.0–100.0)
Platelets: 185 10*3/uL (ref 150–400)
RBC: 3.44 MIL/uL — ABNORMAL LOW (ref 3.87–5.11)
RDW: 13.2 % (ref 11.5–15.5)
WBC: 13.7 10*3/uL — ABNORMAL HIGH (ref 4.0–10.5)
nRBC: 0 % (ref 0.0–0.2)

## 2019-04-19 LAB — VARICELLA ZOSTER ANTIBODY, IGG: Varicella IgG: 479 index (ref >165)

## 2019-04-19 LAB — SURGICAL PATHOLOGY

## 2019-04-19 MED ORDER — POLYETHYLENE GLYCOL 3350 17 G PO PACK
17.0000 g | PACK | Freq: Every day | ORAL | Status: DC
  Administered 2019-04-19 – 2019-04-20 (×2): 17 g via ORAL
  Filled 2019-04-19 (×3): qty 1

## 2019-04-19 MED ORDER — GLYCERIN (LAXATIVE) 2.1 G RE SUPP
1.0000 | RECTAL | Status: DC | PRN
  Filled 2019-04-19: qty 1

## 2019-04-19 MED ORDER — ACETAMINOPHEN 500 MG PO TABS
1000.0000 mg | ORAL_TABLET | Freq: Four times a day (QID) | ORAL | Status: DC | PRN
  Administered 2019-04-19 – 2019-04-21 (×8): 1000 mg via ORAL
  Filled 2019-04-19 (×8): qty 2

## 2019-04-19 NOTE — Clinical Social Work Maternal (Signed)
  CLINICAL SOCIAL WORK MATERNAL/CHILD NOTE  Patient Details  Name: Elizabeth Shea MRN: [id number] Date of Birth: [birth year]  Date:  04/19/2019  Clinical Social Worker Initiating Note:  Annamaria Boots Date/Time: Initiated:  04/19/19/1145     Child's Name:  Cornelia Copa   Biological Parents:  Mother, Father   Need for Interpreter:  None   Reason for Referral:  Current Substance Use/Substance Use During Pregnancy    Address:  [place] [address]    Phone number:  [phone] (home)     Additional phone number:   Household Members/Support Persons (HM/SP):       HM/SP Name Relationship DOB or Age  HM/SP -1        HM/SP -2        HM/SP -3        HM/SP -4        HM/SP -5        HM/SP -6        HM/SP -7        HM/SP -8          Natural Supports (not living in the home):  Children, Spouse/significant other   Professional Supports: None   Employment: Unemployed   Type of Work:     Education:  Programmer, systems   Homebound arranged:    Museum/gallery curator Resources:  [employer]   Other Resources:  [employer], Physicist, medical    Cultural/Religious Considerations Which May Impact Care:    Strengths:  Ability to meet basic needs , Home prepared for child , Pediatrician chosen   Psychotropic Medications:         Pediatrician:    [employer]  Pediatrician List:   [place]      Pediatrician Fax Number:    Risk Factors/Current Problems:  Substance Use    Cognitive State:  Alert    Mood/Affect:  Calm , Comfortable    CSW Assessment: CSW consulted for current substance use. CSW met with patient and husband at bedside. Patient reports that this is her 4th baby Cornelia Copa). Husband reports that they now have 4 boys. Patient states that she had her first baby when she was 44 years old. Patient reports that she does not work and that her husband does "odd  jobs". Patient is currently on Medicaid and has Medicaid for baby and has food stamps. CSW asked patient about drug use. Patient states that she smoked marijuana in her third trimester due to nausea. Patient reports that she is currently on suboxone. CSW explained that since patient and baby both tested positive for marijuana that a CPS report has to be done. Patient states understanding and reports that with her third child she had the same problem and a report was made. CSW made CPS report with [facility].  CSW Plan/Description:  Child Protective Service Report     Annamaria Boots, Latanya Presser 04/19/2019, 2:13 PM

## 2019-04-19 NOTE — Anesthesia Postprocedure Evaluation (Cosign Needed)
Anesthesia Post Note  Patient: Elizabeth Shea  Procedure(s) Performed: CESAREAN SECTION WITH BILATERAL TUBAL LIGATION (Bilateral )  Patient location during evaluation: Mother Baby Anesthesia Type: Spinal Level of consciousness: awake, awake and alert, oriented and patient cooperative Pain management: pain level controlled Respiratory status: spontaneous breathing, nonlabored ventilation and respiratory function stable Cardiovascular status: stable Postop Assessment: no headache, no backache, no apparent nausea or vomiting, patient able to bend at knees, adequate PO intake and able to ambulate Anesthetic complications: no     Last Vitals:  Vitals:   04/18/19 1915 04/18/19 2331  BP: 108/76 103/74  Pulse: 74 78  Resp: 18 18  Temp: 36.7 C 36.8 C  SpO2: 100% 98%    Last Pain:  Vitals:   04/18/19 2331  TempSrc: Oral  PainSc:                  Ricki Miller

## 2019-04-19 NOTE — Lactation Note (Signed)
This note was copied from a baby's chart. Lactation Consultation Note  Patient Name: Elizabeth Shea [id number] Date: 04/19/2019   [facility] checked in on mom. Mom was pumping for the second time since providing the DEBP. Mom's left breast flange was changed to size 34mm, appeared to have a better fit and colostrum was being expressed. Mom had suction level high, stating she still didn't feel anything, cautioned against level being too high, and encouraged to slowly increase next time. LC adjusted suction level down 1-2 notches. Mom understands the importance of continuing frequent breast stimulation through direct feeds and pumping every 2-3 hours. She plans to continue providing expressed milk as long as can.  Plan is for baby to continue to feed on cue, baby given all expressed milk plus supplement to equal 79ml/feed to aid in lowering bilirubin levels. Baby will continue with phototherapy at this time.  Maternal Data    Feeding Feeding Type: Breast Milk with Formula added Nipple Type: Slow - flow  LATCH Score                   Interventions    Lactation Tools Discussed/Used     Consult Status      Lavonia Drafts 04/19/2019, 4:59 PM

## 2019-04-19 NOTE — Lactation Note (Signed)
This note was copied from a baby's chart. Lactation Consultation Note  Patient Name: Elizabeth Shea [id number] Date: 04/19/2019 Reason for consult: Follow-up assessment  LC provided mom with DEBP due to baby Elizabeth Shea having elevated bilirubin levels and beginning phototherapy.  DEBP set-up and use explained, reviewed how to assemble parts, educated on cleaning, and use of pump. Assisted mom with fitting of 61mm breast flange on right breast, and 78mm breast flange on left with coconut oil used to minimize friction.  LC started DEBP, and assisted with increasing suction as tolerated. Mom expressed 64ml of colostrum in the 15 minute pumping session, which will be given first. Mom was asked about supplement options, if needed, between donor milk and formula to aid in lowering bilirubin, parents have chosen formula to be used if needed. Mom was instructed to feed baby on cue, and use DEBP every 2-3 hours, follow-up instructions written on whiteboard in her room. [facility] name and number written, encouraged to call out with questions/concerns, or assistance.  Maternal Data Formula Feeding for Exclusion: Yes Reason for exclusion: Mother's choice to formula and breast feed on admission Has patient been taught Hand Expression?: Yes  Feeding Feeding Type: Breast Fed  LATCH Score                   Interventions Interventions: DEBP;Coconut oil;Breast feeding basics reviewed  Lactation Tools Discussed/Used Tools: Pump Breast pump type: Double-Electric Breast Pump WIC Program: Yes Pump Review: Milk Storage;Setup, frequency, and cleaning Initiated by:: Gerome Sam, MPH, IBCLC Date initiated:: 04/19/19   Consult Status Consult Status: Follow-up Date: 04/19/19 Follow-up type: In-patient    Elizabeth Shea 04/19/2019, 11:44 AM

## 2019-04-19 NOTE — Progress Notes (Signed)
Obstetric Postpartum/PostOperative Daily Progress Note Subjective:  28 y.o. [id number] post-operative day # 1 status post repeat cesarean delivery.  She is ambulating, is tolerating po, is voiding spontaneously.  Her pain is controlled on PO pain medications. Her lochia is less than menses. Baby is latching well on left and with nipple shield on right.   Medications SCHEDULED MEDICATIONS  . buprenorphine-naloxone  1 tablet Sublingual BID  . ketorolac  30 mg Intravenous Q6H   Followed by  . ibuprofen  800 mg Oral Q6H  . ketorolac  30 mg Intravenous Once  . nicotine  7 mg Transdermal Daily  . prenatal multivitamin  1 tablet Oral Q1200  . senna-docusate  2 tablet Oral Q24H  . simethicone  80 mg Oral TID PC  . simethicone  80 mg Oral Q24H  . Tdap  0.5 mL Intramuscular Once    MEDICATION INFUSIONS  . bupivacaine 0.25 % ON-Q pump DUAL CATH 400 mL    . lactated ringers    . naLOXone [facility]) adult infusion for PRURITIS      PRN MEDICATIONS  acetaminophen, coconut oil, witch hazel-glycerin **AND** dibucaine, diphenhydrAMINE, fentaNYL (SUBLIMAZE) injection, ketorolac **OR** ketorolac, menthol-cetylpyridinium, naloxone **AND** sodium chloride flush, naLOXone (NARCAN) adult infusion for PRURITIS, ondansetron (ZOFRAN) IV, simethicone, zolpidem    Objective:   Vitals:   04/18/19 2300 04/18/19 2331 04/19/19 0500 04/19/19 0700  BP:  103/74    Pulse:  78    Resp:  18    Temp:  98.3 F (36.8 C)    TempSrc:  Oral    SpO2: 96% 98% 96% 94%  Weight:      Height:        Current Vital Signs 24h Vital Sign Ranges  T 98.3 F (36.8 C) Temp  Avg: 98 F (36.7 C)  Min: 97.6 F (36.4 C)  Max: 98.4 F (36.9 C)  BP 103/74 BP  Min: 94/65  Max: 117/70  HR 78 Pulse  Avg: 63.1  Min: 51  Max: 86  RR 18 Resp  Avg: 15.6  Min: 3  Max: 25  SaO2 94 % Room Air SpO2  Avg: 99.4 %  Min: 94 %  Max: 100 %       24 Hour I/O Current Shift I/O  Time Ins Outs 09/22 0701 - 09/23 0700 In: 1569.8 [I.V.:1469.8] Out:  1550 [Urine:825] No intake/output data recorded.  General: NAD Pulmonary: no increased work of breathing Abdomen: non-distended, non-tender, fundus firm at level of umbilicus Inc: Clean/dry/intact, On Q pump intact Extremities: no edema, no erythema, no tenderness  Labs:  Recent Labs  Lab 04/18/19 0725 04/19/19 0442  WBC 11.6* 13.7*  HGB 11.7* 10.0*  HCT 35.9* 30.4*  PLT 221 185     Assessment:   28 y.o. [id number] postoperative day # 1 status post repeat cesarean section  Plan:  1) Acute blood loss anemia - hemodynamically stable and asymptomatic - po ferrous sulfate  2) O POS / Rubella 1.12 (09/22 0725)/ Varicella Immune  3) TDAP status given antepartum  4) Breastfeeding/Contraception = bilateral tubal ligation  5) Disposition: continue current care   Rod Can, CNM 04/19/2019 9:04 AM

## 2019-04-20 MED ORDER — INFLUENZA VAC SPLIT QUAD 0.5 ML IM SUSY: 0.5000 mL | PREFILLED_SYRINGE | INTRAMUSCULAR | Status: DC

## 2019-04-20 NOTE — Lactation Note (Signed)
Lactation Consultation Note  Patient Name: MONTINE HIGHT [id number]'O Date: 04/20/2019 Reason for consult: Initial assessment   Maternal Data  Breast pumping reviewed. Mother was shown how to clean the pump parts and tubing as well as  Sanitize them each day.   Lactation Tools Discussed/Used Tools: Pump   Consult Status  continue to follow. Mother will get a pump from [facility].    Lorenz Coaster Vincen Bejar 04/20/2019, 4:26 PM

## 2019-04-20 NOTE — Progress Notes (Signed)
POD#2 rLTCS/BTL Subjective:  Fatigued, otherwise well. Reports pain control is acceptable, but having a lot more pain since Toradol was discontinued. Feeling better when she is up and around. On-Q pump is helping. Ambulating and voiding without difficulty. Tolerating a regular diet. Baby is under bili lights, has been hard for her to rest.  Objective:  Blood pressure 125/87, pulse 66, temperature 98.1 F (36.7 C), temperature source Oral, resp. rate 20, height 5\' 3"  (1.6 m), weight 96.2 kg, last menstrual period 07/17/2018, SpO2 98 %, unknown if currently breastfeeding.  General: NAD Pulmonary: no increased work of breathing Abdomen: non-distended, non-tender, fundus firm at U/1, lochia appropriate Incision: Dressing C/D/I, On-Q in place Extremities: trace edema, no erythema, no tenderness  Results for orders placed or performed during the hospital encounter of 04/18/19 (from the past 72 hour(s))  Urine Drug Screen, Qualitative ([facility] only)     Status: Abnormal   Collection Time: 04/18/19  7:12 AM  Result Value Ref Range   Tricyclic, Ur Screen NONE DETECTED NONE DETECTED   Amphetamines, Ur Screen NONE DETECTED NONE DETECTED   MDMA (Ecstasy)Ur Screen NONE DETECTED NONE DETECTED   Cocaine Metabolite,Ur [place] NONE DETECTED NONE DETECTED   Opiate, Ur Screen NONE DETECTED NONE DETECTED   Phencyclidine (PCP) Ur S NONE DETECTED NONE DETECTED   Cannabinoid 50 Ng, Ur [place] POSITIVE (A) NONE DETECTED   Barbiturates, Ur Screen NONE DETECTED NONE DETECTED   Benzodiazepine, Ur Scrn NONE DETECTED NONE DETECTED   Methadone Scn, Ur NONE DETECTED NONE DETECTED    Comment: (NOTE) Tricyclics + metabolites, urine    Cutoff 1000 ng/mL Amphetamines + metabolites, urine  Cutoff 1000 ng/mL MDMA (Ecstasy), urine              Cutoff 500 ng/mL Cocaine Metabolite, urine          Cutoff 300 ng/mL Opiate + metabolites, urine        Cutoff 300 ng/mL Phencyclidine (PCP), urine         Cutoff 25 ng/mL Cannabinoid, urine                  Cutoff 50 ng/mL Barbiturates + metabolites, urine  Cutoff 200 ng/mL Benzodiazepine, urine              Cutoff 200 ng/mL Methadone, urine                   Cutoff 300 ng/mL The urine drug screen provides only a preliminary, unconfirmed analytical test result and should not be used for non-medical purposes. Clinical consideration and professional judgment should be applied to any positive drug screen result due to possible interfering substances. A more specific alternate chemical method must be used in order to obtain a confirmed analytical result. Gas chromatography / mass spectrometry (GC/MS) is the preferred confirmat ory method. Performed at [facility], [address] [place], [place] [address]   CBC     Status: Abnormal   Collection Time: 04/18/19  7:25 AM  Result Value Ref Range   WBC 11.6 (H) 4.0 - 10.5 K/uL   RBC 4.10 3.87 - 5.11 MIL/uL   Hemoglobin 11.7 (L) 12.0 - 15.0 g/dL   HCT 60.435.9 (L) 54.036.0 - 98.146.0 %   MCV 87.6 80.0 - 100.0 fL   MCH 28.5 26.0 - 34.0 pg   MCHC 32.6 30.0 - 36.0 g/dL   RDW 19.113.2 47.811.5 - 29.515.5 %   Platelets 221 150 - 400 K/uL   nRBC 0.0 0.0 -  0.2 %    Comment: Performed at [facility], [address] [place], [place] [address]  Measles/Mumps/Rubella Immunity     Status: Abnormal   Collection Time: 04/18/19  7:25 AM  Result Value Ref Range   Rubella 1.12 Immune >0.99 index    Comment: (NOTE)                                Non-immune       <0.90                                Equivocal  0.90 - 0.99                                Immune           >0.99    Rubeola IgG 83.3 Immune >16.4 AU/mL    Comment: (NOTE)                                 Negative        <13.5                                 Equivocal 13.5 - 16.4                                 Positive        >16.4 Presence of antibodies to Rubeola is presumptive evidence of immunity except when acute infection is suspected.    Mumps IgG <9.0 (L) Immune >10.9  AU/mL    Comment: (NOTE)                                Negative         <9.0                                Equivocal  9.0 - 10.9                                Positive        >10.9 A positive result generally indicates past exposure to Mumps virus or previous vaccination. Performed At: [facility] [address] [place], [place] [id number] Jolene Schimke MD [id number]   Varicella zoster antibody, IgG     Status: None   Collection Time: 04/18/19  7:25 AM  Result Value Ref Range   Varicella IgG 479 Immune >165 index    Comment: (NOTE)                               Negative          <135                               Equivocal    135 - 165  Positive          >165 A positive result generally indicates exposure to the pathogen or administration of specific immunoglobulins, but it is not indication of active infection or stage of disease. Performed At: [facility] [place], [place] [id number] Rush Farmer MD [id number]   Type and screen     Status: None   Collection Time: 04/18/19  7:54 AM  Result Value Ref Range   ABO/RH(D) O POS    Antibody Screen NEG    Sample Expiration      04/21/2019,2359 Performed at [facility], [address] [place], [place] [address]   Surgical pathology     Status: None   Collection Time: 04/18/19  9:11 AM  Result Value Ref Range   SURGICAL PATHOLOGY      SURGICAL PATHOLOGY CASE: [id number] PATIENT: Elizabeth Shea Surgical Pathology Report     Specimen Submitted: A. Portion of fallopian tube, right B. Portion of fallopian tube, left  Clinical History: Prior h/o 3 cesarean sections planned at 39 wks term pregnancy.  Desire for sterility.    DIAGNOSIS: A. FALLOPIAN TUBE SEGMENT, RIGHT; STERILIZATION: - FALLOPIAN TUBE WITH NO SIGNIFICANT HISTOPATHOLOGIC CHANGE, SEEN ON FULL CROSS SECTION X2.  B. FALLOPIAN TUBE SEGMENT, LEFT; STERILIZATION: - FALLOPIAN  TUBE WITH NO SIGNIFICANT HISTOPATHOLOGIC CHANGE, SEEN ON FULL CROSS SECTION X2.  GROSS DESCRIPTION: A. Labeled: Portion of right fallopian tube Received: In formalin Integrity: Intact, complete without fimbriated end Size: 0.9 cm in length and 0.7 cm in diameter Serosa: Pink-tan smooth and shiny On sections: Grossly unremarkable lumen present Perforation: Not present Additional abnormalities: not present Summary of Sections: 1-entirely submitted  B. Labeled : " Portion of left fallopian tube" Received: In formalin Integrity: Intact, complete without fimbriated end Size: 0.9 cm in length and 0.7 cm in diameter Serosa: Pink-tan smooth and shiny On sections: Grossly unremarkable lumen present Perforation: Not present Additional abnormalities: not present Summary of Sections: 1-entirely submitted  Final Diagnosis performed by Allena Napoleon, MD.   Electronically signed 04/19/2019 1:39:54PM The electronic signature indicates that the named Attending Pathologist has evaluated the specimen Technical component performed at [facility], [address] [place], [place] [address] Lab: [phone] Dir: Rush Farmer, MD, MMM  Professional component performed at [facility], [facility], [place], [place], [place] [address] Lab: [phone] Dir: Dellia Nims. Rubinas, MD   CBC     Status: Abnormal   Collection Time: 04/19/19  4:42 AM  Result Value Ref Range   WBC 13.7 (H) 4.0 - 10.5 K/uL   RBC 3.44 (L) 3.87 - 5.11 MIL/uL   Hemoglobin 10.0 (L) 12.0 - 15.0 g/dL   HCT 30.4 (L) 36.0 - 46.0 %   MCV 88.4 80.0 - 100.0 fL   MCH 29.1 26.0 - 34.0 pg   MCHC 32.9 30.0 - 36.0 g/dL   RDW 13.2 11.5 - 15.5 %   Platelets 185 150 - 400 K/uL   nRBC 0.0 0.0 - 0.2 %    Comment: Performed at [facility], [address] [place], [place] [address]     Assessment:   28 y.o. [phone] postoperative day #2 recovering well  Plan:  1) Acute blood loss anemia - hemodynamically stable  and asymptomatic - PO ferrous sulfate  2) Blood Type --/--/O POS (09/22 0754) / Rubella 1.12 (09/22 0725) / Varicella Immune  3) TDAP status: received antepartum 02/20/2019  4) Breast and formula feeding/Contraception BTL  5) Disposition: continue postpartum care.  Ebony Hail  Bridgette Habermann, CNM 04/20/2019  10:21 AM

## 2019-04-20 NOTE — Progress Notes (Signed)
   04/20/19 1100  Clinical Encounter Type  Visited With Patient and family together  Visit Type Initial;Spiritual support  Referral From Nurse  Consult/Referral To Chaplain  Spiritual Encounters  Spiritual Needs Prayer;Emotional  Stress Factors  Patient Stress Factors Other (Comment)  Family Stress Factors Other (Comment)  Chaplain visit with patient and husband was with her and baby. Chaplain introduced herself and patient was glad to see the Chaplain. Husband ask if there was still a Chapel here and Chaplain said yes. Chaplain gave patient a blanket and hat for baby. Patient was grateful she was hoping that gift was for them. Chaplain prayed a prayer of comfort, peace and covering. Patient and husband appreciate prayer and thanked Clinical biochemist. Chaplain wish them well.

## 2019-04-21 NOTE — Progress Notes (Signed)
D/C order from [facility].  Reviewed d/c instructions with patient and answered any questions. Infant to remain in hospital until Sunday, so patient left the hospital to go pick up prescriptions and a breast pump from [facility] and will be returning shortly---infant in respite care until she returns.

## 2019-04-21 NOTE — Plan of Care (Signed)
Reviewed d/c instructions and prescriptions with patient and answered any questions. Infant to remain hospitalized until Sunday 9/27/202 so patient to remain with infant.

## 2019-04-25 ENCOUNTER — Encounter: Payer: Self-pay | Admitting: Obstetrics & Gynecology

## 2019-05-02 NOTE — Addendum Note (Signed)
Addended by: Cletis Media on: 05/02/2019 01:01 PM   Modules accepted: Orders

## 2019-05-04 ENCOUNTER — Telehealth: Payer: Self-pay | Admitting: Licensed Clinical Social Worker

## 2019-05-04 NOTE — Telephone Encounter (Signed)
LCSW spoke with patient. Conducted brief mood check - patient voices having anxiety and some panic attacks and reports that she is seeing a therapist at her doctor's office [place]. LCSW mentioned Hydroxyzine as a medication she could ask her doctor about, if needed for anxiety/panic attacks. Patient reports she continues to go to [place] for Subutex. LCSW discussed the recommendation of staying on it for one year postpartum. LCSW encouraged patient to contact the health department if she has any needs in the future.

## 2019-05-17 ENCOUNTER — Telehealth: Payer: Self-pay

## 2019-05-17 NOTE — Telephone Encounter (Signed)
Patient states her c section wound is open in the middle under her belly button and near the end on her right side. Describes opening as qtip head size. Foul smell to drainage. States her vans power steering is out and that may be what caused it. She can see stitches. No fever. Advised on keeping wound clean dry, not scrubbing, using mild soap & water, No hydrogen peroixde. Apt was offered for 8 am w/RPH tomorrow morning. Is she going to try to keep that apt.

## 2019-05-18 ENCOUNTER — Ambulatory Visit: Payer: Medicaid Other | Admitting: Obstetrics & Gynecology

## 2019-06-05 ENCOUNTER — Telehealth: Payer: Self-pay

## 2019-06-05 NOTE — Telephone Encounter (Signed)
Attempted to call re: PP appt; left message with husband [name], RN

## 2019-12-06 ENCOUNTER — Ambulatory Visit: Payer: Medicaid Other

## 2019-12-16 ENCOUNTER — Emergency Department
Admission: EM | Admit: 2019-12-16 | Discharge: 2019-12-16 | Disposition: A | Discharge status: Home / Self Care | Payer: Medicaid Other | Attending: Emergency Medicine | Admitting: Emergency Medicine

## 2019-12-16 ENCOUNTER — Emergency Department: Payer: Medicaid Other

## 2019-12-16 ENCOUNTER — Other Ambulatory Visit: Payer: Self-pay

## 2019-12-16 ENCOUNTER — Encounter: Payer: Self-pay | Admitting: Emergency Medicine

## 2019-12-16 DIAGNOSIS — X500XXA Overexertion from strenuous movement or load, initial encounter: Secondary | ICD-10-CM | POA: Diagnosis not present

## 2019-12-16 DIAGNOSIS — Y9339 Activity, other involving climbing, rappelling and jumping off: Secondary | ICD-10-CM | POA: Diagnosis not present

## 2019-12-16 DIAGNOSIS — J45909 Unspecified asthma, uncomplicated: Secondary | ICD-10-CM | POA: Diagnosis not present

## 2019-12-16 DIAGNOSIS — S93601A Unspecified sprain of right foot, initial encounter: Secondary | ICD-10-CM | POA: Insufficient documentation

## 2019-12-16 DIAGNOSIS — F17210 Nicotine dependence, cigarettes, uncomplicated: Secondary | ICD-10-CM | POA: Insufficient documentation

## 2019-12-16 DIAGNOSIS — Y999 Unspecified external cause status: Secondary | ICD-10-CM | POA: Insufficient documentation

## 2019-12-16 DIAGNOSIS — Y929 Unspecified place or not applicable: Secondary | ICD-10-CM | POA: Diagnosis not present

## 2019-12-16 DIAGNOSIS — Z79899 Other long term (current) drug therapy: Secondary | ICD-10-CM | POA: Insufficient documentation

## 2019-12-16 DIAGNOSIS — I10 Essential (primary) hypertension: Secondary | ICD-10-CM | POA: Insufficient documentation

## 2019-12-16 DIAGNOSIS — S99921A Unspecified injury of right foot, initial encounter: Secondary | ICD-10-CM | POA: Diagnosis present

## 2019-12-16 MED ORDER — OXYCODONE-ACETAMINOPHEN 5-325 MG PO TABS
1.0000 | ORAL_TABLET | Freq: Once | ORAL | Status: AC
  Administered 2019-12-16: 1 via ORAL
  Filled 2019-12-16: qty 1

## 2019-12-16 MED ORDER — MELOXICAM 15 MG PO TABS: 15.0000 mg | ORAL_TABLET | Freq: Every day | ORAL | 0 refills | Status: DC

## 2019-12-16 NOTE — ED Triage Notes (Signed)
Pt arrives POV to triage where she c/o right foot pain. Pt states that she was following a squirrel to feed it and tried to climb a fence. Pt reports landing the wrong way and hearing a "crunch". Pt is in NAD.

## 2019-12-16 NOTE — ED Notes (Signed)
See triage note- pt here after falling ~10 feet from a wooden fence, fell on right foot. Right foot and ankle have swelling present. Good cap refil, 2+ pedal pulses. Pt tearful on assessment. Denies LOC or hitting head.

## 2019-12-16 NOTE — ED Provider Notes (Signed)
[facility] Emergency Department Provider Note  ____________________________________________  Time seen: Approximately 8:21 PM  I have reviewed the triage vital signs and the nursing notes.   HISTORY  Chief Complaint Foot Pain    HPI Elizabeth Shea is a 29 y.o. female who presents the emergency department complaining of right foot injury and pain.  Patient states that she was attempting to climb over a fence when she landed awkwardly on her right foot.  She states that she felt a popping sensation, had immediate pain followed by a cold sensation.  Patient states that she has a ladder pressure, swelling, ecchymosis to the foot.  She is concerned that she may have sustained a fracture.  No history of fractures to the ankle or foot.  No other injury or complaint today.  Patient has not take any medications prior to arrival.         Past Medical History:  Diagnosis Date  . Anemia   . Asthma    WELL CONTROLLED  . GERD (gastroesophageal reflux disease)   . History of kidney stones   . Hypertension    NOT ON ANY MEDS-PCP TRYING TO FIGURE OUT IF THIS IS PIH OR HTN  . Panic attack     Patient Active Problem List   Diagnosis Date Noted  . Postpartum care following cesarean delivery 04/21/2019  . History of 3 cesarean sections 04/18/2019  . Marijuana use 03/28/2019  . Family history of seizures 10/25/2018  . History of cesarean delivery affecting pregnancy 10/24/2018  . Previous preterm labor affecting pregnancy, antepartum 10/24/2018  . Tobacco use affecting pregnancy, antepartum 10/24/2018  . Family history of autism   . Hx of self-harm 09/28/2018  . Supervision of high risk pregnancy, multigravida, antepartum 09/27/2018  . Obesity, unspecified 09/27/2018  . Hypertension 06/13/2018  . Bipolar disorder, unspecified (HCC) 02/17/2017  . Attention deficit hyperactivity disorder 02/17/2017  . Opiate abuse, episodic (HCC) 04/10/2015  . Cannabis abuse 04/10/2015     Past Surgical History:  Procedure Laterality Date  . CESAREAN SECTION     3  . CESAREAN SECTION WITH BILATERAL TUBAL LIGATION Bilateral 04/18/2019   Procedure: CESAREAN SECTION WITH BILATERAL TUBAL LIGATION;  Surgeon: Nadara Mustard, MD;  Location: [facility] ORS;  Service: Obstetrics;  Laterality: Bilateral;  . CHOLECYSTECTOMY    . KIDNEY STONE SURGERY      Prior to Admission medications   Medication Sig Start Date End Date Taking? Authorizing Provider  buprenorphine (SUBUTEX) 8 MG SUBL SL tablet Place 8 mg under the tongue 2 (two) times daily.     [provider]  meloxicam (MOBIC) 15 MG tablet Take 1 tablet (15 mg total) by mouth daily. 12/16/19   Stellar Gensel, Delorise Royals, PA-C    Allergies Tramadol  Family History  Problem Relation Age of Onset  . Seizures Mother   . Hypertension Mother   . Hypertension Father   . Cancer Paternal Grandmother        Breast   . Diabetes Paternal Grandmother   . Autism Son     Social History Social History   Tobacco Use  . Smoking status: Current Every Day Smoker    Packs/day: 0.50    Years: 8.00    Pack years: 4.00    Types: Cigarettes  . Smokeless tobacco: Never Used  Substance Use Topics  . Alcohol use: Never  . Drug use: Yes    Types: Marijuana    Comment: Subutex-EVERY DAY Twice daily  Review of Systems  Constitutional: No fever/chills Eyes: No visual changes. No discharge ENT: No upper respiratory complaints. Cardiovascular: no chest pain. Respiratory: no cough. No SOB. Gastrointestinal: No abdominal pain.  No nausea, no vomiting.  Musculoskeletal: Positive for right foot injury Skin: Negative for rash, abrasions, lacerations, ecchymosis. Neurological: Negative for headaches, focal weakness or numbness. 10-point ROS otherwise negative.  ____________________________________________   PHYSICAL EXAM:  VITAL SIGNS: ED Triage Vitals  Enc Vitals Group     BP 12/16/19 1914 112/65     Pulse Rate 12/16/19  1914 96     Resp 12/16/19 1914 (!) 21     Temp 12/16/19 1914 98.2 F (36.8 C)     Temp Source 12/16/19 1914 Oral     SpO2 12/16/19 1914 97 %     Weight 12/16/19 1913 189 lb (85.7 kg)     Height 12/16/19 1913 5\' 3"  (1.6 m)     Head Circumference --      Peak Flow --      Pain Score 12/16/19 1912 10     Pain Loc --      Pain Edu? --      Excl. in GC? --      Constitutional: Alert and oriented. Well appearing and in no acute distress. Eyes: Conjunctivae are normal. PERRL. EOMI. Head: Atraumatic. ENT:      Ears:       Nose: No congestion/rhinnorhea.      Mouth/Throat: Mucous membranes are moist.  Neck: No stridor.    Cardiovascular: Normal rate, regular rhythm. Normal S1 and S2.  Good peripheral circulation. Respiratory: Normal respiratory effort without tachypnea or retractions. Lungs CTAB. Good air entry to the bases with no decreased or absent breath sounds. Musculoskeletal: Full range of motion to all extremities. No gross deformities appreciated.  Visualization of the right foot reveals significant ecchymosis along the lateral aspect of the foot.  Edema accompanies this ecchymosis.  No abrasions, lacerations.  No deformity of the ankle itself.  Limited range of motion due to pain.  Palpation reveals tenderness over edematous and ecchymotic region.  No palpable underlying findings.  Sensation intact all 5 digits.  Capillary refill less than 2 seconds all digits.  Dorsalis pedis pulse region is under the significant area of ecchymosis and not fully appreciated. Neurologic:  Normal speech and language. No gross focal neurologic deficits are appreciated.  Skin:  Skin is warm, dry and intact. No rash noted. Psychiatric: Mood and affect are normal. Speech and behavior are normal. Patient exhibits appropriate insight and judgement.   ____________________________________________   LABS (all labs ordered are listed, but only abnormal results are displayed)  Labs Reviewed - No data to  display ____________________________________________  EKG   ____________________________________________  RADIOLOGY I personally viewed and evaluated these images as part of my medical decision making, as well as reviewing the written report by the radiologist.  DG Foot Complete Right  Result Date: 12/16/2019 CLINICAL DATA:  Status post trauma. EXAM: RIGHT FOOT COMPLETE - 3+ VIEW COMPARISON:  None. FINDINGS: There is no evidence of an acute fracture or dislocation. There is no evidence of arthropathy or other focal bone abnormality. Mild dorsal soft tissue swelling is seen along the mid right foot. IMPRESSION: Mild dorsal soft tissue swelling without evidence of acute osseous abnormality. Electronically Signed   By: 12/18/2019 M.D.   On: 12/16/2019 20:17    ____________________________________________    PROCEDURES  Procedure(s) performed:    Procedures    Medications  oxyCODONE-acetaminophen (PERCOCET/ROXICET) 5-325 MG per tablet 1 tablet (has no administration in time range)     ____________________________________________   INITIAL IMPRESSION / ASSESSMENT AND PLAN / ED COURSE  Pertinent labs & imaging results that were available during my care of the patient were reviewed by me and considered in my medical decision making (see chart for details).  Review of the [place] CSRS was performed in accordance of the [place] prior to dispensing any controlled drugs.           Patient's diagnosis is consistent with foot sprain.  Patient presented to emergency department after injuring her right foot climbing over a fence.  Patient landed awkwardly felt a popping sensation.  Imaging reveals no acute osseous abnormality.  Significant ecchymosis and edema is appreciated.  Patient will be placed in walking boot, crutches to assist with ambulation and weightbearing.  Patient is prescribed Suboxone on a regular basis and as such no narcotics will be prescribed.  Patient replaced on  meloxicam for additional symptom control.  Follow-up with podiatry as needed..  Patient is given ED precautions to return to the ED for any worsening or new symptoms.     ____________________________________________  FINAL CLINICAL IMPRESSION(S) / ED DIAGNOSES  Final diagnoses:  Sprain of right foot, initial encounter      NEW MEDICATIONS STARTED DURING THIS VISIT:  ED Discharge Orders         Ordered    meloxicam (MOBIC) 15 MG tablet  Daily     12/16/19 2133              This chart was dictated using voice recognition software/Dragon. Despite best efforts to proofread, errors can occur which can change the meaning. Any change was purely unintentional.    Brynda Peon 12/16/19 2135    Nance Pear, MD 12/16/19 [phone]

## 2020-11-20 ENCOUNTER — Ambulatory Visit: Payer: Medicaid Other | Admitting: Obstetrics & Gynecology

## 2020-11-22 ENCOUNTER — Ambulatory Visit: Payer: Medicaid Other | Admitting: Obstetrics & Gynecology

## 2021-02-06 NOTE — Congregational Nurse Program (Signed)
  Dept: [phone]   Congregational Nurse Program Note  Date of Encounter: 02/06/2021  Past Medical History: Past Medical History:  Diagnosis Date   Anemia    Asthma    WELL CONTROLLED   GERD (gastroesophageal reflux disease)    History of kidney stones    Hypertension    NOT ON ANY MEDS-PCP TRYING TO FIGURE OUT IF THIS IS PIH OR HTN   Panic attack     Encounter Details:  CNP Questionnaire - 02/05/21 1145       Questionnaire   Do you give verbal consent to treat you today? Yes    Visit Setting Church or Engineer, technical sales Patient Served At [employer], [employer]    Patient Status Homeless    Medical Provider No    Insurance Medicaid    Intervention Refer;Support;Advocate    Housing/Utilities No permanent housing    Transportation Need transportation assistance    Interpersonal Safety Do not feel physically and emotionally safe where you currently live    Food Have food insecurities    Referrals PCP - [facility];Other           Client into [employer] for hotel voucher as family of 6 has just become homeless. Co. Re: HIPAA. Agrees to services. States she drinks lots of Dr. Reino Kent and wonders if that has caused symptoms. States she frequently gets UTI's. Also recently self-treated for yeast infection. Several days symptoms have been burning w/ urination, urgency, discomfort in perineum. Has stopped soda and increased water and cranberry-based juices. Reinforced changes. Assisted with scheduling appointment at [facility] with Franco Nones FNP for 02/06/21 at 2:45 pm. For symptoms. To follow up with appt to establish care. Has medicaid and food stamps. Client states has zero gas money. Co Re; accessing free bus-line in [place]. Referred to [employer] for supplies to include diapers and wipes. Provided [employer] RTC to this nurse prn. Rhermann, RN

## 2021-11-01 ENCOUNTER — Emergency Department: Payer: Medicaid Other

## 2021-11-01 ENCOUNTER — Other Ambulatory Visit: Payer: Self-pay

## 2021-11-01 ENCOUNTER — Emergency Department
Admission: EM | Admit: 2021-11-01 | Discharge: 2021-11-01 | Disposition: A | Discharge status: Home / Self Care | Payer: Medicaid Other | Attending: Emergency Medicine | Admitting: Emergency Medicine

## 2021-11-01 DIAGNOSIS — R059 Cough, unspecified: Secondary | ICD-10-CM | POA: Diagnosis present

## 2021-11-01 DIAGNOSIS — J40 Bronchitis, not specified as acute or chronic: Secondary | ICD-10-CM | POA: Diagnosis not present

## 2021-11-01 DIAGNOSIS — J019 Acute sinusitis, unspecified: Secondary | ICD-10-CM

## 2021-11-01 DIAGNOSIS — Z20822 Contact with and (suspected) exposure to covid-19: Secondary | ICD-10-CM | POA: Insufficient documentation

## 2021-11-01 DIAGNOSIS — Z720 Tobacco use: Secondary | ICD-10-CM

## 2021-11-01 DIAGNOSIS — R42 Dizziness and giddiness: Secondary | ICD-10-CM | POA: Diagnosis not present

## 2021-11-01 DIAGNOSIS — R079 Chest pain, unspecified: Secondary | ICD-10-CM

## 2021-11-01 DIAGNOSIS — F17200 Nicotine dependence, unspecified, uncomplicated: Secondary | ICD-10-CM | POA: Diagnosis not present

## 2021-11-01 LAB — CBC
HCT: 43.8 % (ref 36.0–46.0)
Hemoglobin: 15 g/dL (ref 12.0–15.0)
MCH: 31.1 pg (ref 26.0–34.0)
MCHC: 34.2 g/dL (ref 30.0–36.0)
MCV: 90.9 fL (ref 80.0–100.0)
Platelets: 242 10*3/uL (ref 150–400)
RBC: 4.82 MIL/uL (ref 3.87–5.11)
RDW: 12.3 % (ref 11.5–15.5)
WBC: 8.2 10*3/uL (ref 4.0–10.5)
nRBC: 0 % (ref 0.0–0.2)

## 2021-11-01 LAB — BASIC METABOLIC PANEL
Anion gap: 5 (ref 5–15)
BUN: 15 mg/dL (ref 6–20)
CO2: 27 mmol/L (ref 22–32)
Calcium: 9.3 mg/dL (ref 8.9–10.3)
Chloride: 106 mmol/L (ref 98–111)
Creatinine, Ser: 0.85 mg/dL (ref 0.44–1.00)
GFR, Estimated: >60 mL/min (ref >60)
Glucose, Bld: 84 mg/dL (ref 70–99)
Potassium: 4 mmol/L (ref 3.5–5.1)
Sodium: 138 mmol/L (ref 135–145)

## 2021-11-01 LAB — RESP PANEL BY RT-PCR (FLU A&B, COVID) ARPGX2
Influenza A by PCR: NEGATIVE
Influenza B by PCR: NEGATIVE
SARS Coronavirus 2 by RT PCR: NEGATIVE

## 2021-11-01 LAB — URINALYSIS, ROUTINE W REFLEX MICROSCOPIC
Bilirubin Urine: NEGATIVE
Glucose, UA: NEGATIVE mg/dL
Hgb urine dipstick: NEGATIVE
Ketones, ur: NEGATIVE mg/dL
Leukocytes,Ua: NEGATIVE
Nitrite: NEGATIVE
Protein, ur: NEGATIVE mg/dL
Specific Gravity, Urine: 1.024 (ref 1.005–1.030)
pH: 7 (ref 5.0–8.0)

## 2021-11-01 LAB — TROPONIN I (HIGH SENSITIVITY): Troponin I (High Sensitivity): 3 ng/L (ref <18)

## 2021-11-01 MED ORDER — IOHEXOL 350 MG/ML SOLN
75.0000 mL | Freq: Once | INTRAVENOUS | Status: AC | PRN
  Administered 2021-11-01: 75 mL via INTRAVENOUS

## 2021-11-01 MED ORDER — AMOXICILLIN-POT CLAVULANATE 875-125 MG PO TABS: 1.0000 | ORAL_TABLET | Freq: Two times a day (BID) | ORAL | 0 refills | Status: AC

## 2021-11-01 MED ORDER — LACTATED RINGERS IV BOLUS
1000.0000 mL | Freq: Once | INTRAVENOUS | Status: AC
  Administered 2021-11-01: 1000 mL via INTRAVENOUS

## 2021-11-01 NOTE — ED Provider Notes (Signed)
? ?[facility] ?Provider Note ? ? ? Event Date/Time  ? First MD Initiated Contact with Patient 11/01/21 1541   ?  (approximate) ? ? ?History  ? ?Dizziness ? ? ?HPI ? ?Elizabeth Shea is a 31 y.o. female with a past medical history of opioid abuse currently on buprenorphine for the last 2 to 3 years, tobacco abuse and patient describes as elevated blood pressure associated with pregnancy several years ago were complicated by development of a seizure who presents for evaluation of approximately 1 week of cough associated with some chest pain and "sweatiness" in the head and mild headache.  Patient also endorses some pressure in her frontal sinuses and congestion.  No earache, sore throat, fevers, visual changes, hemoptysis, abdominal pain, nausea, vomiting, diarrhea rash or extremity pain.  No urinary symptoms.  Patient states he often has her symptoms in the morning in the evenings.  No alleviating aggravating factors.  She was referred to come to the ED when she went there to be assessed and was noted to reportedly have elevated blood pressures of 250/170.  She states she is not currently treated for high blood pressure and only had it when she was pregnant 6 years ago. ? ?  ? ? ?Physical Exam  ?Triage Vital Signs: ?ED Triage Vitals  ?Enc Vitals Group  ?   BP   ?   Pulse   ?   Resp   ?   Temp   ?   Temp src   ?   SpO2   ?   Weight   ?   Height   ?   Head Circumference   ?   Peak Flow   ?   Pain Score   ?   Pain Loc   ?   Pain Edu?   ?   Excl. in GC?   ? ? ?Most recent vital signs: ?Vitals:  ? 11/01/21 1700 11/01/21 1715  ?BP: 104/69 117/78  ?Pulse: 78 66  ?Resp: 13 15  ?Temp:    ?SpO2: 100% 97%  ? ? ?General: Awake, no distress.  ?CV:  Good peripheral perfusion.  2+ radial pulse. ?Resp:  Normal effort.  Clear bilaterally. ?Abd:  No distention.  Soft. ?Other:  Cranial nerves II through XII grossly intact.  No pronator drift.  No finger dysmetria.  Symmetric 5/5 strength of all extremities.   Sensation intact to light touch in all extremities.  Unremarkable unassisted gait. ? ?Oropharynx unremarkable.  TMs unremarkable bilaterally. ? ? ?ED Results / Procedures / Treatments  ?Labs ?(all labs ordered are listed, but only abnormal results are displayed) ?Labs Reviewed  ?URINALYSIS, ROUTINE W REFLEX MICROSCOPIC - Abnormal; Notable for the following components:  ?    Result Value  ? Color, Urine YELLOW (*)   ? APPearance HAZY (*)   ? All other components within normal limits  ?RESP PANEL BY RT-PCR (FLU A&B, COVID) ARPGX2  ?BASIC METABOLIC PANEL  ?CBC  ?TROPONIN I (HIGH SENSITIVITY)  ? ? ? ?EKG ? ?EKG is remarkable sinus rhythm with some artifact with otherwise unremarkable intervals and within limits of ECG no clear evidence of acute ischemia or significant arrhythmia.  V5 is borderline nondiagnostic. ? ? ?RADIOLOGY ? ?CT head on my interpretation without evidence of venous sinus thrombosis, hemorrhage, mass effect, edema, ischemia or other clear acute intracranial process.  I also reviewed radiology interpretation and agree with their findings of some paranasal sinusitis but no fluid in the middle ears or  mastoid. ? ?Chest reviewed by myself shows no focal consoidation, effusion, edema, pneumothorax or other clear acute thoracic process. I also reviewed radiology interpretation and agree with findings described. ? ? ? ?PROCEDURES: ? ?Critical Care performed: No ? ?.1-3 Lead EKG Interpretation ?Performed by: Gilles Chiquito, MD ?Authorized by: Gilles Chiquito, MD  ? ?  Interpretation: normal   ?  ECG rate assessment: normal   ?  Rhythm: sinus rhythm   ?  Ectopy: none   ?  Conduction: normal   ? ?The patient is on the cardiac monitor to evaluate for evidence of arrhythmia and/or significant heart rate changes. ? ? ?MEDICATIONS ORDERED IN ED: ?Medications  ?lactated ringers bolus 1,000 mL (1,000 mLs Intravenous New Bag/Given 11/01/21 1645)  ?iohexol (OMNIPAQUE) 350 MG/ML injection 75 mL (75 mLs Intravenous  Contrast Given 11/01/21 1731)  ? ? ? ?IMPRESSION / MDM / ASSESSMENT AND PLAN / ED COURSE  ?I reviewed the triage vital signs and the nursing notes. ?             ?               ? ?Differential diagnosis includes, but is not limited to bursitis, bronchitis, GERD, ACS, pneumonia, DVT, anemia, metabolic derangements and arrhythmia.  I have a low suspicion for PE as patient is PERC negative she is not on any, or control. ? ?EKG is remarkable sinus rhythm with some artifact with otherwise unremarkable intervals and within limits of ECG no clear evidence of acute ischemia or significant arrhythmia.  V5 is borderline nondiagnostic.  Given nonelevated troponin obtained greater than 3 hours at time of my onset of her low suspicion for ACS or myocarditis. ? ?CT head on my interpretation without evidence of venous sinus thrombosis, hemorrhage, mass effect, edema, ischemia or other clear acute intracranial process.  I also reviewed radiology interpretation and agree with their findings of some paranasal sinusitis but no fluid in the middle ears or mastoid. ? ?Chest reviewed by myself shows no focal consoidation, effusion, edema, pneumothorax or other clear acute thoracic process. I also reviewed radiology interpretation and agree with findings described. ? ?CBC without leukocytosis or acute anemia.  BMP without significant electrolyte or metabolic derangements.  UA is unremarkable.  COVID influenza PCR is negative ? ?Suspect likely acute sinusitis and bronchitis as primary etiology of patient's symptoms.  I have low suspicion for other immediate life-threatening process.  Given has been just over a week somewhat difficult to exclude bacterial sinusitis and so will cover with a short course of Augmentin.  Patient otherwise hemodynamically stable with very reassuring work-up and exam I have low suspicion for other immediate life and process at this time such as a temporal arteritis, mastoiditis, meningitis or other deep space  infection in the head or neck.  Do not believe patient is septic.  I think she is stable for discharge with outpatient follow-up.  Counseled on tobacco cessation.  Discharged in stable condition with recommendation to follow-up with PCP. ? ?  ? ? ?FINAL CLINICAL IMPRESSION(S) / ED DIAGNOSES  ? ?Final diagnoses:  ?Dizziness  ?Acute sinusitis, recurrence not specified, unspecified location  ?Chest pain, unspecified type  ?Bronchitis  ?Tobacco abuse  ? ? ? ?Rx / DC Orders  ? ?ED Discharge Orders   ? ?      Ordered  ?  amoxicillin-clavulanate (AUGMENTIN) 875-125 MG tablet  2 times daily       ? 11/01/21 1813  ? ?  ?  ? ?  ? ? ? ?  Note:  This document was prepared using Dragon voice recognition software and may include unintentional dictation errors. ?  ?Gilles ChiquitoSmith, Dorrien Grunder P, MD ?11/01/21 1817 ? ?

## 2021-11-01 NOTE — ED Triage Notes (Signed)
Pt states the last couple days she has felt dizzy and chest pain- pt states she went to [facility] today and was told her BP was 250/170 and sent her here- pt states that this has happened before about 6 years ago after she had a baby and she had a seizure- pt not on any BP meds ?

## 2022-03-05 ENCOUNTER — Ambulatory Visit
Admission: EM | Admit: 2022-03-05 | Discharge: 2022-03-05 | Disposition: A | Discharge status: Home / Self Care | Payer: Medicaid Other | Attending: Emergency Medicine | Admitting: Emergency Medicine

## 2022-03-05 DIAGNOSIS — R350 Frequency of micturition: Secondary | ICD-10-CM | POA: Diagnosis not present

## 2022-03-05 DIAGNOSIS — N898 Other specified noninflammatory disorders of vagina: Secondary | ICD-10-CM

## 2022-03-05 DIAGNOSIS — R3915 Urgency of urination: Secondary | ICD-10-CM | POA: Diagnosis not present

## 2022-03-05 LAB — POCT URINALYSIS DIP (MANUAL ENTRY)
Bilirubin, UA: NEGATIVE
Glucose, UA: NEGATIVE mg/dL
Ketones, POC UA: NEGATIVE mg/dL
Nitrite, UA: NEGATIVE
Protein Ur, POC: 100 mg/dL — AB
Spec Grav, UA: 1.025 (ref 1.010–1.025)
Urobilinogen, UA: 1 E.U./dL
pH, UA: 7 (ref 5.0–8.0)

## 2022-03-05 MED ORDER — NITROFURANTOIN MONOHYD MACRO 100 MG PO CAPS: 100.0000 mg | ORAL_CAPSULE | Freq: Two times a day (BID) | ORAL | 0 refills | Status: DC

## 2022-03-05 NOTE — ED Provider Notes (Addendum)
[name]    CSN: [id number] Arrival date & time: 03/05/22  1634      History   Chief Complaint Chief Complaint  Patient presents with   Dysuria    HPI Elizabeth Shea is a 31 y.o. female.   Patient presents with urinary frequency, urgency, dysuria and a small amount of Teka Chanda discharge for 7 days.   urgency was so severe that she was unable to make it to the bathroom 1 day ago . symptoms endorses after she attempted to have anal sex with husband, in a monogamous relationship, no known exposure.  Denies vaginal itching, odor, irritation, lower abdominal pain or pressure, flank pain, fever, chills, new rash or lesions.  History of kidney stones.  Has attempted use of over-the-counter Pyridium which has been ineffective.  Past Medical History:  Diagnosis Date   Anemia    Asthma    WELL CONTROLLED   GERD (gastroesophageal reflux disease)    History of kidney stones    Hypertension    NOT ON ANY MEDS-PCP TRYING TO FIGURE OUT IF THIS IS PIH OR HTN   Panic attack     Patient Active Problem List   Diagnosis Date Noted   Postpartum care following cesarean delivery 04/21/2019   History of 3 cesarean sections 04/18/2019   Marijuana use 03/28/2019   Family history of seizures 10/25/2018   History of cesarean delivery affecting pregnancy 10/24/2018   Previous preterm labor affecting pregnancy, antepartum 10/24/2018   Tobacco use affecting pregnancy, antepartum 10/24/2018   Family history of autism    Hx of self-harm 09/28/2018   Supervision of high risk pregnancy, multigravida, antepartum 09/27/2018   Obesity, unspecified 09/27/2018   Hypertension 06/13/2018   Bipolar disorder, unspecified (HCC) 02/17/2017   Attention deficit hyperactivity disorder 02/17/2017   Opiate abuse, episodic (HCC) 04/10/2015   Cannabis abuse 04/10/2015    Past Surgical History:  Procedure Laterality Date   CESAREAN SECTION     3   CESAREAN SECTION WITH BILATERAL TUBAL LIGATION Bilateral  04/18/2019   Procedure: CESAREAN SECTION WITH BILATERAL TUBAL LIGATION;  Surgeon: Nadara Mustard, MD;  Location: [facility] ORS;  Service: Obstetrics;  Laterality: Bilateral;   CHOLECYSTECTOMY     KIDNEY STONE SURGERY      OB History     Gravida  5   Para  4   Term  2   Preterm  2   AB  1   Living  4      SAB  1   IAB      Ectopic      Multiple  0   Live Births  1            Home Medications    Prior to Admission medications   Medication Sig Start Date End Date Taking? Authorizing Provider  nitrofurantoin, macrocrystal-monohydrate, (MACROBID) 100 MG capsule Take 1 capsule (100 mg total) by mouth 2 (two) times daily. 03/05/22  Yes Chane Cowden R, NP  buprenorphine (SUBUTEX) 8 MG SUBL SL tablet Place 8 mg under the tongue 2 (two) times daily.     [provider]  meloxicam (MOBIC) 15 MG tablet Take 1 tablet (15 mg total) by mouth daily. 12/16/19   Cuthriell, Delorise Royals, PA-C    Family History Family History  Problem Relation Age of Onset   Seizures Mother    Hypertension Mother    Hypertension Father    Cancer Paternal Grandmother        Breast  Diabetes Paternal Grandmother    Autism Son     Social History Social History   Tobacco Use   Smoking status: Every Day    Packs/day: 0.50    Years: 8.00    Total pack years: 4.00    Types: Cigarettes   Smokeless tobacco: Never  Vaping Use   Vaping Use: Never used  Substance Use Topics   Alcohol use: Never   Drug use: Yes    Types: Marijuana    Comment: Subutex-EVERY DAY Twice daily     Allergies   Tramadol and Suboxone [buprenorphine hcl-naloxone hcl]   Review of Systems Review of Systems  Constitutional: Negative.   Respiratory: Negative.    Genitourinary:  Positive for dysuria, frequency and urgency. Negative for decreased urine volume, difficulty urinating, dyspareunia, enuresis, flank pain, genital sores, hematuria, menstrual problem, pelvic pain, vaginal bleeding, vaginal  discharge and vaginal pain.  Skin: Negative.      Physical Exam Triage Vital Signs ED Triage Vitals  Enc Vitals Group     BP 03/05/22 1708 120/83     Pulse Rate 03/05/22 1708 63     Resp 03/05/22 1708 18     Temp 03/05/22 1708 98.6 F (37 C)     Temp Source 03/05/22 1708 Oral     SpO2 03/05/22 1708 95 %     Weight --      Height --      Head Circumference --      Peak Flow --      Pain Score 03/05/22 1657 7     Pain Loc --      Pain Edu? --      Excl. in GC? --    No data found.  Updated Vital Signs BP 120/83 (BP Location: Left Arm)   Pulse 63   Temp 98.6 F (37 C) (Oral)   Resp 18   LMP 02/19/2022   SpO2 95%   Visual Acuity Right Eye Distance:   Left Eye Distance:   Bilateral Distance:    Right Eye Near:   Left Eye Near:    Bilateral Near:     Physical Exam Vitals and nursing note reviewed.  Constitutional:      Appearance: Normal appearance. She is normal weight.  HENT:     Head: Normocephalic.  Eyes:     Extraocular Movements: Extraocular movements intact.     Conjunctiva/sclera: Conjunctivae normal.  Pulmonary:     Effort: Pulmonary effort is normal.  Musculoskeletal:        General: Normal range of motion.     Cervical back: Normal range of motion.  Skin:    General: Skin is warm and dry.  Neurological:     Mental Status: She is alert and oriented to person, place, and time. Mental status is at baseline.  Psychiatric:        Mood and Affect: Mood normal.        Behavior: Behavior normal.        Thought Content: Thought content normal.        Judgment: Judgment normal.      UC Treatments / Results  Labs (all labs ordered are listed, but only abnormal results are displayed) Labs Reviewed  POCT URINALYSIS DIP (MANUAL ENTRY) - Abnormal; Notable for the following components:      Result Value   Clarity, UA cloudy (*)    Blood, UA moderate (*)    Protein Ur, POC =100 (*)    Leukocytes, UA Moderate (  2+) (*)    All other components within  normal limits  URINE CULTURE  CERVICOVAGINAL ANCILLARY ONLY    EKG   Radiology No results found.  Procedures Procedures (including critical care time)  Medications Ordered in UC Medications - No data to display  Initial Impression / Assessment and Plan / UC Course  I have reviewed the triage vital signs and the nursing notes.  Pertinent labs & imaging results that were available during my care of the patient were reviewed by me and considered in my medical decision making (see chart for details).  Urinary frequency, urinary urgency  Urinalysis showing Sena Hoopingarner blood cells, negative for nitrates, sent for culture, STI panel pending, will treat prophylactically for any urinary infection, Macrobid sent to pharmacy, discussed administration, will treat further per protocol, advised over-the-counter analgesics, Pyridium, good hygiene and increase fluid intake as additional supportive measures, may follow-up with urgent care as needed Final Clinical Impressions(s) / UC Diagnoses   Final diagnoses:  Urinary frequency  Urinary urgency     Discharge Instructions      Your urinalysis shows Jammy Plotkin blood cells and nitrates which are indicative of infection, your urine will be sent to the lab to determine exactly which bacteria is present, if any changes need to be made to your medications you will be notified  Your MyChart account is already active and your username is [email or web address], you may go online and change your password  Vaginal swab results are pending, we will notify you of positive test results only and send in medication at time of notification  Begin use of Macrobid every morning every evening for 5 days  You may use over-the-counter Azo to help minimize your symptoms until antibiotic removes bacteria, this medication will turn your urine orange  Increase your fluid intake through use of water  As always practice good hygiene, wiping front to back and avoidance of scented  vaginal products to prevent further irritation  If symptoms continue to persist after use of medication or recur please follow-up with urgent care or your primary doctor as needed    ED Prescriptions     Medication Sig Dispense Auth. Provider   nitrofurantoin, macrocrystal-monohydrate, (MACROBID) 100 MG capsule Take 1 capsule (100 mg total) by mouth 2 (two) times daily. 10 capsule Valinda Hoar, NP      PDMP not reviewed this encounter.   Valinda Hoar, NP 03/05/22 1723    Valinda Hoar, NP 03/05/22 1921

## 2022-03-05 NOTE — ED Triage Notes (Signed)
Patient presents to Urgent Care with complaints of vaginal discharge x 3 days ago,  dysuria x 2 days ago. Some hematuria since this morning, unsure if she passed a kidney stone since she has a hx of kidney stones. Self-treated for possible yeast with monistat. Requesting STD testing.

## 2022-03-05 NOTE — Discharge Instructions (Addendum)
Your urinalysis shows Mackey Varricchio blood cells and nitrates which are indicative of infection, your urine will be sent to the lab to determine exactly which bacteria is present, if any changes need to be made to your medications you will be notified  Your MyChart account is already active and your username is [email or web address], you may go online and change your password  Vaginal swab results are pending, we will notify you of positive test results only and send in medication at time of notification  Begin use of Macrobid every morning every evening for 5 days  You may use over-the-counter Azo to help minimize your symptoms until antibiotic removes bacteria, this medication will turn your urine orange  Increase your fluid intake through use of water  As always practice good hygiene, wiping front to back and avoidance of scented vaginal products to prevent further irritation  If symptoms continue to persist after use of medication or recur please follow-up with urgent care or your primary doctor as needed

## 2022-03-06 LAB — CERVICOVAGINAL ANCILLARY ONLY
Bacterial Vaginitis (gardnerella): NEGATIVE
Candida Glabrata: NEGATIVE
Candida Vaginitis: NEGATIVE
Chlamydia: NEGATIVE
Comment: NEGATIVE
Comment: NEGATIVE
Comment: NEGATIVE
Comment: NEGATIVE
Comment: NEGATIVE
Comment: NORMAL
Neisseria Gonorrhea: NEGATIVE
Trichomonas: NEGATIVE

## 2022-03-07 LAB — URINE CULTURE: Culture: >=100000 — AB

## 2022-03-09 ENCOUNTER — Telehealth: Payer: Self-pay | Admitting: Family Medicine

## 2022-03-09 MED ORDER — CIPROFLOXACIN HCL 500 MG PO TABS: 500.0000 mg | ORAL_TABLET | Freq: Two times a day (BID) | ORAL | 0 refills | Status: AC

## 2022-03-09 NOTE — Telephone Encounter (Signed)
Patient called still symptomatic with UTI. Per urine culture Macrobid should have been sensitive. Given ongoing symptoms, sending over Ciprofloxcin 50 mg BID x 5 days. RN called to advised patient if symptoms persist following completing this medication a new visit will be require.

## 2022-03-26 ENCOUNTER — Emergency Department
Admission: EM | Admit: 2022-03-26 | Discharge: 2022-03-26 | Disposition: A | Discharge status: Home / Self Care | Payer: No Typology Code available for payment source | Attending: Emergency Medicine | Admitting: Emergency Medicine

## 2022-03-26 ENCOUNTER — Emergency Department: Payer: No Typology Code available for payment source

## 2022-03-26 ENCOUNTER — Other Ambulatory Visit: Payer: Self-pay

## 2022-03-26 DIAGNOSIS — S0993XA Unspecified injury of face, initial encounter: Secondary | ICD-10-CM | POA: Diagnosis not present

## 2022-03-26 DIAGNOSIS — S20212A Contusion of left front wall of thorax, initial encounter: Secondary | ICD-10-CM | POA: Insufficient documentation

## 2022-03-26 DIAGNOSIS — Y92481 Parking lot as the place of occurrence of the external cause: Secondary | ICD-10-CM | POA: Diagnosis not present

## 2022-03-26 DIAGNOSIS — S301XXA Contusion of abdominal wall, initial encounter: Secondary | ICD-10-CM | POA: Insufficient documentation

## 2022-03-26 DIAGNOSIS — M542 Cervicalgia: Secondary | ICD-10-CM | POA: Insufficient documentation

## 2022-03-26 DIAGNOSIS — S299XXA Unspecified injury of thorax, initial encounter: Secondary | ICD-10-CM | POA: Diagnosis present

## 2022-03-26 DIAGNOSIS — R040 Epistaxis: Secondary | ICD-10-CM | POA: Diagnosis not present

## 2022-03-26 LAB — CBC WITH DIFFERENTIAL/PLATELET
Abs Immature Granulocytes: 0.02 10*3/uL (ref 0.00–0.07)
Basophils Absolute: 0.1 10*3/uL (ref 0.0–0.1)
Basophils Relative: 1 %
Eosinophils Absolute: 0.2 10*3/uL (ref 0.0–0.5)
Eosinophils Relative: 2 %
HCT: 44.9 % (ref 36.0–46.0)
Hemoglobin: 15 g/dL (ref 12.0–15.0)
Immature Granulocytes: 0 %
Lymphocytes Relative: 29 %
Lymphs Abs: 3.2 10*3/uL (ref 0.7–4.0)
MCH: 30.9 pg (ref 26.0–34.0)
MCHC: 33.4 g/dL (ref 30.0–36.0)
MCV: 92.4 fL (ref 80.0–100.0)
Monocytes Absolute: 0.7 10*3/uL (ref 0.1–1.0)
Monocytes Relative: 6 %
Neutro Abs: 6.9 10*3/uL (ref 1.7–7.7)
Neutrophils Relative %: 62 %
Platelets: 256 10*3/uL (ref 150–400)
RBC: 4.86 MIL/uL (ref 3.87–5.11)
RDW: 12.1 % (ref 11.5–15.5)
WBC: 11 10*3/uL — ABNORMAL HIGH (ref 4.0–10.5)
nRBC: 0 % (ref 0.0–0.2)

## 2022-03-26 LAB — COMPREHENSIVE METABOLIC PANEL
ALT: 18 U/L (ref 0–44)
AST: 19 U/L (ref 15–41)
Albumin: 4.5 g/dL (ref 3.5–5.0)
Alkaline Phosphatase: 74 U/L (ref 38–126)
Anion gap: 8 (ref 5–15)
BUN: 16 mg/dL (ref 6–20)
CO2: 25 mmol/L (ref 22–32)
Calcium: 9.4 mg/dL (ref 8.9–10.3)
Chloride: 106 mmol/L (ref 98–111)
Creatinine, Ser: 0.85 mg/dL (ref 0.44–1.00)
GFR, Estimated: >60 mL/min (ref >60)
Glucose, Bld: 87 mg/dL (ref 70–99)
Potassium: 4.3 mmol/L (ref 3.5–5.1)
Sodium: 139 mmol/L (ref 135–145)
Total Bilirubin: 0.5 mg/dL (ref 0.3–1.2)
Total Protein: 7.4 g/dL (ref 6.5–8.1)

## 2022-03-26 LAB — POC URINE PREG, ED: Preg Test, Ur: NEGATIVE

## 2022-03-26 MED ORDER — METHOCARBAMOL 500 MG PO TABS: 500.0000 mg | ORAL_TABLET | Freq: Four times a day (QID) | ORAL | 0 refills | Status: DC

## 2022-03-26 MED ORDER — IOHEXOL 300 MG/ML  SOLN
100.0000 mL | Freq: Once | INTRAMUSCULAR | Status: AC | PRN
  Administered 2022-03-26: 100 mL via INTRAVENOUS

## 2022-03-26 MED ORDER — MELOXICAM 15 MG PO TABS: 15.0000 mg | ORAL_TABLET | Freq: Every day | ORAL | 0 refills | Status: AC

## 2022-03-26 NOTE — ED Notes (Signed)
E signature pad not working. Pt educated on discharge instructions and verbalized understanding.  
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
## 2022-03-26 NOTE — ED Notes (Signed)
Poct pregnancy Negative 

## 2023-05-02 ENCOUNTER — Ambulatory Visit
Admission: EM | Admit: 2023-05-02 | Discharge: 2023-05-02 | Disposition: A | Discharge status: Home / Self Care | Payer: MEDICAID | Attending: Emergency Medicine | Admitting: Emergency Medicine

## 2023-05-02 DIAGNOSIS — L292 Pruritus vulvae: Secondary | ICD-10-CM | POA: Insufficient documentation

## 2023-05-02 DIAGNOSIS — N898 Other specified noninflammatory disorders of vagina: Secondary | ICD-10-CM | POA: Insufficient documentation

## 2023-05-02 DIAGNOSIS — Z3202 Encounter for pregnancy test, result negative: Secondary | ICD-10-CM | POA: Insufficient documentation

## 2023-05-02 LAB — POCT URINE PREGNANCY: Preg Test, Ur: NEGATIVE

## 2023-05-02 MED ORDER — FLUCONAZOLE 150 MG PO TABS: 150.0000 mg | ORAL_TABLET | Freq: Every day | ORAL | 0 refills | Status: DC

## 2023-05-02 NOTE — ED Triage Notes (Signed)
Patient to Urgent Care with complaints of vaginal discharge and itching that started five days ago.  Taking amoxicillin for a dental infection. Reports she stopped taking it this morning d/t the vaginal symptoms.   No concerns for STDs.

## 2023-05-02 NOTE — ED Provider Notes (Signed)
Elizabeth Shea    CSN: [id number] Arrival date & time: 05/02/23  1332      History   Chief Complaint Chief Complaint  Patient presents with   Vaginal Discharge    HPI Elizabeth Shea is a 32 y.o. female.  Patient presents with vaginal discharge and itching x 5 days.  Her symptoms began after she started taking amoxicillin for a dental infection.  She denies fever, rash, dysuria, hematuria, abdominal pain, flank pain, pelvic pain, or other symptoms.  No treatments at home.  Patient stopped taking the amoxicillin this morning due to her vaginal symptoms.  The history is provided by the patient and medical records.    Past Medical History:  Diagnosis Date   Anemia    Asthma    WELL CONTROLLED   GERD (gastroesophageal reflux disease)    History of kidney stones    Hypertension    NOT ON ANY MEDS-PCP TRYING TO FIGURE OUT IF THIS IS PIH OR HTN   Panic attack     Patient Active Problem List   Diagnosis Date Noted   Postpartum care following cesarean delivery 04/21/2019   History of 3 cesarean sections 04/18/2019   Marijuana use 03/28/2019   Family history of seizures 10/25/2018   History of cesarean delivery affecting pregnancy 10/24/2018   Previous preterm labor affecting pregnancy, antepartum 10/24/2018   Tobacco use affecting pregnancy, antepartum 10/24/2018   Family history of autism    Hx of self-harm 09/28/2018   Supervision of high risk pregnancy, multigravida, antepartum 09/27/2018   Obesity, unspecified 09/27/2018   Hypertension 06/13/2018   Bipolar disorder, unspecified (HCC) 02/17/2017   Attention deficit hyperactivity disorder 02/17/2017   Opiate abuse, episodic (HCC) 04/10/2015   Cannabis abuse 04/10/2015    Past Surgical History:  Procedure Laterality Date   CESAREAN SECTION     3   CESAREAN SECTION WITH BILATERAL TUBAL LIGATION Bilateral 04/18/2019   Procedure: CESAREAN SECTION WITH BILATERAL TUBAL LIGATION;  Surgeon: Nadara Mustard, MD;   Location: [facility] ORS;  Service: Obstetrics;  Laterality: Bilateral;   CHOLECYSTECTOMY     KIDNEY STONE SURGERY      OB History     Gravida  5   Para  4   Term  2   Preterm  2   AB  1   Living  4      SAB  1   IAB      Ectopic      Multiple  0   Live Births  1            Home Medications    Prior to Admission medications   Medication Sig Start Date End Date Taking? Authorizing Provider  amoxicillin (AMOXIL) 500 MG capsule Take 500 mg by mouth every 8 (eight) hours. 04/20/23  Yes [provider]  fluconazole (DIFLUCAN) 150 MG tablet Take 1 tablet (150 mg total) by mouth daily. Take one tablet today.  May repeat in 3 days. 05/02/23  Yes Mickie Bail, NP  LUCEMYRA 0.18 MG TABS Take by mouth. 12/07/22  Yes [provider]  buprenorphine (SUBUTEX) 8 MG SUBL SL tablet Place 8 mg under the tongue 2 (two) times daily.     [provider]  methocarbamol (ROBAXIN) 500 MG tablet Take 1 tablet (500 mg total) by mouth 4 (four) times daily. Patient not taking: Reported on 05/02/2023 03/26/22   Cuthriell, Delorise Royals, PA-C  nitrofurantoin, macrocrystal-monohydrate, (MACROBID) 100 MG capsule Take 1 capsule (100  mg total) by mouth 2 (two) times daily. Patient not taking: Reported on 05/02/2023 03/05/22   Valinda Hoar, NP    Family History Family History  Problem Relation Age of Onset   Seizures Mother    Hypertension Mother    Hypertension Father    Cancer Paternal Grandmother        Breast    Diabetes Paternal Grandmother    Autism Son     Social History Social History   Tobacco Use   Smoking status: Every Day    Current packs/day: 0.50    Average packs/day: 0.5 packs/day for 8.0 years (4.0 ttl pk-yrs)    Types: Cigarettes   Smokeless tobacco: Never  Vaping Use   Vaping status: Never Used  Substance Use Topics   Alcohol use: Never   Drug use: Yes    Types: Marijuana    Comment: Subutex-EVERY DAY Twice daily     Allergies    Tramadol and Suboxone [buprenorphine hcl-naloxone hcl]   Review of Systems Review of Systems  Constitutional:  Negative for chills and fever.  Gastrointestinal:  Negative for abdominal pain, nausea and vomiting.  Genitourinary:  Positive for vaginal discharge. Negative for dysuria, flank pain, frequency, hematuria and pelvic pain.  Skin:  Negative for color change and rash.     Physical Exam Triage Vital Signs ED Triage Vitals  Encounter Vitals Group     BP 05/02/23 1423 112/83     Systolic BP Percentile --      Diastolic BP Percentile --      Pulse Rate 05/02/23 1423 69     Resp 05/02/23 1423 18     Temp 05/02/23 1423 97.6 F (36.4 C)     Temp src --      SpO2 05/02/23 1423 98 %     Weight --      Height --      Head Circumference --      Peak Flow --      Pain Score 05/02/23 1420 0     Pain Loc --      Pain Education --      Exclude from Growth Chart --    No data found.  Updated Vital Signs BP 112/83   Pulse 69   Temp 97.6 F (36.4 C)   Resp 18   LMP 04/07/2023   SpO2 98%   Visual Acuity Right Eye Distance:   Left Eye Distance:   Bilateral Distance:    Right Eye Near:   Left Eye Near:    Bilateral Near:     Physical Exam Vitals and nursing note reviewed.  Constitutional:      General: She is not in acute distress.    Appearance: She is well-developed.  HENT:     Mouth/Throat:     Mouth: Mucous membranes are moist.  Cardiovascular:     Rate and Rhythm: Normal rate and regular rhythm.  Pulmonary:     Effort: Pulmonary effort is normal.  Abdominal:     General: Bowel sounds are normal.     Palpations: Abdomen is soft.     Tenderness: There is no abdominal tenderness. There is no right CVA tenderness, left CVA tenderness, guarding or rebound.  Musculoskeletal:     Cervical back: Neck supple.  Skin:    General: Skin is warm and dry.  Neurological:     Mental Status: She is alert.      UC Treatments / Results  Labs (all labs ordered  are  listed, but only abnormal results are displayed) Labs Reviewed  POCT URINE PREGNANCY  CERVICOVAGINAL ANCILLARY ONLY    EKG   Radiology No results found.  Procedures Procedures (including critical care time)  Medications Ordered in UC Medications - No data to display  Initial Impression / Assessment and Plan / UC Course  I have reviewed the triage vital signs and the nursing notes.  Pertinent labs & imaging results that were available during my care of the patient were reviewed by me and considered in my medical decision making (see chart for details).    Vaginal discharge, vaginal itching, negative pregnancy test.  Patient obtained vaginal self swab for testing.  Treating with Diflucan.  Discussed that we will call if test results are show the need for additional treatment.  Instructed patient to abstain from sexual activity for at least 7 days.  Instructed her to follow-up with her PCP or gynecologist if her symptoms are not improving.  Patient agrees to plan of care.   Final Clinical Impressions(s) / UC Diagnoses   Final diagnoses:  Vaginal discharge  Negative pregnancy test  Vaginal itching     Discharge Instructions      Take the Diflucan as directed.  Your vaginal tests are pending.  We will call you if you need additional treatment.    Follow-up with your primary care provider if your symptoms are not improving.      ED Prescriptions     Medication Sig Dispense Auth. Provider   fluconazole (DIFLUCAN) 150 MG tablet Take 1 tablet (150 mg total) by mouth daily. Take one tablet today.  May repeat in 3 days. 2 tablet Mickie Bail, NP      PDMP not reviewed this encounter.   Mickie Bail, NP 05/02/23 1459

## 2023-05-02 NOTE — Discharge Instructions (Addendum)
Take the Diflucan as directed.  Your vaginal tests are pending.  We will call you if you need additional treatment.    Follow-up with your primary care provider if your symptoms are not improving.

## 2023-05-03 LAB — CERVICOVAGINAL ANCILLARY ONLY
Bacterial Vaginitis (gardnerella): NEGATIVE
Candida Glabrata: POSITIVE — AB
Candida Vaginitis: POSITIVE — AB
Comment: NEGATIVE
Comment: NEGATIVE
Comment: NEGATIVE

## 2023-12-02 ENCOUNTER — Ambulatory Visit
Admission: EM | Admit: 2023-12-02 | Discharge: 2023-12-02 | Disposition: A | Discharge status: Home / Self Care | Payer: MEDICAID | Attending: Emergency Medicine | Admitting: Emergency Medicine

## 2023-12-02 DIAGNOSIS — R300 Dysuria: Secondary | ICD-10-CM | POA: Diagnosis present

## 2023-12-02 DIAGNOSIS — N3000 Acute cystitis without hematuria: Secondary | ICD-10-CM | POA: Insufficient documentation

## 2023-12-02 DIAGNOSIS — Z8619 Personal history of other infectious and parasitic diseases: Secondary | ICD-10-CM | POA: Insufficient documentation

## 2023-12-02 LAB — POCT URINALYSIS DIP (MANUAL ENTRY)
Bilirubin, UA: NEGATIVE
Glucose, UA: NEGATIVE mg/dL
Ketones, POC UA: NEGATIVE mg/dL
Nitrite, UA: NEGATIVE
Spec Grav, UA: 1.025 (ref 1.010–1.025)
Urobilinogen, UA: 0.2 U/dL
pH, UA: 5.5 (ref 5.0–8.0)

## 2023-12-02 LAB — POCT URINE PREGNANCY: Preg Test, Ur: NEGATIVE

## 2023-12-02 MED ORDER — FLUCONAZOLE 150 MG PO TABS: 150.0000 mg | ORAL_TABLET | Freq: Once | ORAL | 0 refills | Status: AC

## 2023-12-02 MED ORDER — CEPHALEXIN 500 MG PO CAPS
500.0000 mg | ORAL_CAPSULE | Freq: Three times a day (TID) | ORAL | 0 refills | Status: AC
Start: 2023-12-02 — End: 2023-12-07

## 2023-12-02 NOTE — ED Triage Notes (Signed)
 Patient to Urgent Care with complaints of dysuria and dark urine.  Reports symptoms started 3 days ago.  Pushing fluids/ cranberry juice.

## 2023-12-02 NOTE — Discharge Instructions (Signed)
 Take the antibiotic as directed.  The urine culture is pending.  We will call you if it shows the need to change or discontinue your antibiotic.    Follow up with your primary care provider if your symptoms are not improving.

## 2023-12-02 NOTE — ED Provider Notes (Signed)
 Elizabeth Shea    CSN: [id number] Arrival date & time: 12/02/23  1626      History   Chief Complaint Chief Complaint  Patient presents with   Dysuria    HPI Elizabeth Shea is a 33 y.o. female.  Patient presents with 3-day history of dysuria and dark cloudy urine.  No fever, chills, abdominal pain, flank pain, hematuria, vaginal discharge, pelvic pain.  Treatment attempted with cranberry juice and increased water intake.  Her medical history includes tubal ligation.  The history is provided by the patient and medical records.    Past Medical History:  Diagnosis Date   Anemia    Asthma    WELL CONTROLLED   GERD (gastroesophageal reflux disease)    History of kidney stones    Hypertension    NOT ON ANY MEDS-PCP TRYING TO FIGURE OUT IF THIS IS PIH OR HTN   Panic attack     Patient Active Problem List   Diagnosis Date Noted   Postpartum care following cesarean delivery 04/21/2019   History of 3 cesarean sections 04/18/2019   Marijuana use 03/28/2019   Family history of seizures 10/25/2018   History of cesarean delivery affecting pregnancy 10/24/2018   Previous preterm labor affecting pregnancy, antepartum 10/24/2018   Tobacco use affecting pregnancy, antepartum 10/24/2018   Family history of autism    Hx of self-harm 09/28/2018   Supervision of high risk pregnancy, multigravida, antepartum 09/27/2018   Obesity, unspecified 09/27/2018   Hypertension 06/13/2018   Bipolar disorder, unspecified (HCC) 02/17/2017   Attention deficit hyperactivity disorder 02/17/2017   Opiate abuse, episodic (HCC) 04/10/2015   Cannabis abuse 04/10/2015    Past Surgical History:  Procedure Laterality Date   CESAREAN SECTION     3   CESAREAN SECTION WITH BILATERAL TUBAL LIGATION Bilateral 04/18/2019   Procedure: CESAREAN SECTION WITH BILATERAL TUBAL LIGATION;  Surgeon: Alben Alma, MD;  Location: [facility] ORS;  Service: Obstetrics;  Laterality: Bilateral;   CHOLECYSTECTOMY     KIDNEY  STONE SURGERY      OB History     Gravida  5   Para  4   Term  2   Preterm  2   AB  1   Living  4      SAB  1   IAB      Ectopic      Multiple  0   Live Births  1            Home Medications    Prior to Admission medications   Medication Sig Start Date End Date Taking? Authorizing Provider  cephALEXin (KEFLEX) 500 MG capsule Take 1 capsule (500 mg total) by mouth 3 (three) times daily for 5 days. 12/02/23 12/07/23 Yes Wellington Half, NP  fluconazole  (DIFLUCAN ) 150 MG tablet Take 1 tablet (150 mg total) by mouth once for 1 dose. 12/02/23 12/02/23 Yes Wellington Half, NP  buprenorphine  (SUBUTEX ) 8 MG SUBL SL tablet Place 8 mg under the tongue in the morning, at noon, and at bedtime.    [provider]  LUCEMYRA 0.18 MG TABS Take by mouth. Patient not taking: Reported on 12/02/2023 12/07/22   [provider]  methocarbamol  (ROBAXIN ) 500 MG tablet Take 1 tablet (500 mg total) by mouth 4 (four) times daily. Patient not taking: Reported on 05/02/2023 03/26/22   Cuthriell, Ardath Bears, PA-C    Family History Family History  Problem Relation Age of Onset   Seizures Mother  Hypertension Mother    Hypertension Father    Cancer Paternal Grandmother        Breast    Diabetes Paternal Grandmother    Autism Son     Social History Social History   Tobacco Use   Smoking status: Every Day    Current packs/day: 0.50    Average packs/day: 0.5 packs/day for 8.0 years (4.0 ttl pk-yrs)    Types: Cigarettes   Smokeless tobacco: Never  Vaping Use   Vaping status: Never Used  Substance Use Topics   Alcohol use: Never   Drug use: Yes    Types: Marijuana    Comment: Subutex -EVERY DAY Twice daily     Allergies   Tramadol  and Suboxone  [buprenorphine  hcl-naloxone  hcl]   Review of Systems Review of Systems  Constitutional:  Negative for chills and fever.  Gastrointestinal:  Negative for abdominal pain.  Genitourinary:  Positive for dysuria. Negative for  hematuria, pelvic pain and vaginal discharge.     Physical Exam Triage Vital Signs ED Triage Vitals  Encounter Vitals Group     BP 12/02/23 1648 106/73     Systolic BP Percentile --      Diastolic BP Percentile --      Pulse Rate 12/02/23 1648 77     Resp 12/02/23 1648 18     Temp 12/02/23 1648 97.7 F (36.5 C)     Temp src --      SpO2 12/02/23 1648 95 %     Weight --      Height --      Head Circumference --      Peak Flow --      Pain Score 12/02/23 1640 10     Pain Loc --      Pain Education --      Exclude from Growth Chart --    No data found.  Updated Vital Signs BP 106/73   Pulse 77   Temp 97.7 F (36.5 C)   Resp 18   LMP 11/11/2023   SpO2 95%   Visual Acuity Right Eye Distance:   Left Eye Distance:   Bilateral Distance:    Right Eye Near:   Left Eye Near:    Bilateral Near:     Physical Exam Constitutional:      General: She is not in acute distress. HENT:     Mouth/Throat:     Mouth: Mucous membranes are moist.  Cardiovascular:     Rate and Rhythm: Normal rate and regular rhythm.  Pulmonary:     Effort: Pulmonary effort is normal. No respiratory distress.  Abdominal:     General: Bowel sounds are normal.     Palpations: Abdomen is soft.     Tenderness: There is no abdominal tenderness. There is no right CVA tenderness, left CVA tenderness, guarding or rebound.  Neurological:     Mental Status: She is alert.      UC Treatments / Results  Labs (all labs ordered are listed, but only abnormal results are displayed) Labs Reviewed  POCT URINALYSIS DIP (MANUAL ENTRY) - Abnormal; Notable for the following components:      Result Value   Color, UA straw (*)    Blood, UA trace-intact (*)    Protein Ur, POC trace (*)    Leukocytes, UA Small (1+) (*)    All other components within normal limits  URINE CULTURE  POCT URINE PREGNANCY    EKG   Radiology No results found.  Procedures Procedures (  including critical care  time)  Medications Ordered in UC Medications - No data to display  Initial Impression / Assessment and Plan / UC Course  I have reviewed the triage vital signs and the nursing notes.  Pertinent labs & imaging results that were available during my care of the patient were reviewed by me and considered in my medical decision making (see chart for details).    Dysuria, acute cystitis, history of vaginal candidiasis with antibiotic use.  Afebrile and vital signs are stable.  Abdomen is soft and nontender with good bowel sounds.  No CVAT.  Treating with Keflex. Urine culture pending. Discussed with patient that we will call her if the urine culture shows the need to change or discontinue the antibiotic.  1 tablet of Diflucan  prescribed also.  Instructed her to follow-up with her PCP if her symptoms are not improving. Patient agrees to plan of care.     Final Clinical Impressions(s) / UC Diagnoses   Final diagnoses:  Dysuria  Acute cystitis without hematuria  History of candidiasis     Discharge Instructions      Take the antibiotic as directed.  The urine culture is pending.  We will call you if it shows the need to change or discontinue your antibiotic.    Follow-up with your primary care provider if your symptoms are not improving.    ED Prescriptions     Medication Sig Dispense Auth. Provider   cephALEXin (KEFLEX) 500 MG capsule Take 1 capsule (500 mg total) by mouth 3 (three) times daily for 5 days. 15 capsule Wellington Half, NP   fluconazole  (DIFLUCAN ) 150 MG tablet Take 1 tablet (150 mg total) by mouth once for 1 dose. 1 tablet Wellington Half, NP      PDMP not reviewed this encounter.   Wellington Half, NP 12/02/23 [phone]

## 2023-12-04 ENCOUNTER — Telehealth: Payer: Self-pay | Admitting: Emergency Medicine

## 2023-12-04 LAB — URINE CULTURE: Culture: >=100000 — AB

## 2023-12-04 MED ORDER — CIPROFLOXACIN HCL 500 MG PO TABS: 500.0000 mg | ORAL_TABLET | Freq: Two times a day (BID) | ORAL | 0 refills | Status: AC

## 2023-12-04 NOTE — Telephone Encounter (Signed)
 Patient notified clinic that she has seen no improvement with use of cephalexin , urine culture at this time shows E. coli but susceptibility report not available, change medicine to ciprofloxacin  to see if this provides better relief, discussed with patient

## 2023-12-05 ENCOUNTER — Telehealth: Payer: Self-pay | Admitting: *Deleted

## 2023-12-06 ENCOUNTER — Ambulatory Visit
Admission: EM | Admit: 2023-12-06 | Discharge: 2023-12-06 | Disposition: A | Discharge status: Home / Self Care | Payer: MEDICAID | Attending: Emergency Medicine | Admitting: Emergency Medicine

## 2023-12-06 DIAGNOSIS — N898 Other specified noninflammatory disorders of vagina: Secondary | ICD-10-CM | POA: Insufficient documentation

## 2023-12-06 DIAGNOSIS — R300 Dysuria: Secondary | ICD-10-CM | POA: Insufficient documentation

## 2023-12-06 LAB — POCT URINALYSIS DIP (MANUAL ENTRY)
Bilirubin, UA: NEGATIVE
Blood, UA: NEGATIVE
Glucose, UA: NEGATIVE mg/dL
Nitrite, UA: NEGATIVE
Protein Ur, POC: 100 mg/dL — AB
Spec Grav, UA: >=1.03 — AB (ref 1.010–1.025)
Urobilinogen, UA: 0.2 U/dL
pH, UA: 6 (ref 5.0–8.0)

## 2023-12-06 LAB — POCT URINE PREGNANCY: Preg Test, Ur: NEGATIVE

## 2023-12-06 NOTE — ED Provider Notes (Signed)
 [name]    CSN: [id number] Arrival date & time: 12/06/23  1729      History   Chief Complaint Chief Complaint  Patient presents with   Dysuria    HPI Elizabeth Shea is a 33 y.o. female.  Patient presents with ongoing dysuria, urinary frequency, dark urine x 10 days.  She also now reports vaginal irritation.  No fever, abdominal pain, flank pain, hematuria, vaginal discharge, pelvic pain.  Patient finished Cipro  today.    Patient was seen here on 12/02/2023; diagnosed with dysuria, acute cystitis, history of candidiasis; treated with cephalexin  and fluconazole ; urine culture positive for E. coli and sensitive to antibiotic.  Patient contacted this urgent care on 12/04/2023 to report that her symptoms were not improving; antibiotic changed to Cipro  at that time.  Patient contacted this urgent care again yesterday to report that her symptoms were not improving and instructed to return for reevaluation.  The history is provided by the patient and medical records.    Past Medical History:  Diagnosis Date   Anemia    Asthma    WELL CONTROLLED   GERD (gastroesophageal reflux disease)    History of kidney stones    Hypertension    NOT ON ANY MEDS-PCP TRYING TO FIGURE OUT IF THIS IS PIH OR HTN   Panic attack     Patient Active Problem List   Diagnosis Date Noted   Postpartum care following cesarean delivery 04/21/2019   History of 3 cesarean sections 04/18/2019   Marijuana use 03/28/2019   Family history of seizures 10/25/2018   History of cesarean delivery affecting pregnancy 10/24/2018   Previous preterm labor affecting pregnancy, antepartum 10/24/2018   Tobacco use affecting pregnancy, antepartum 10/24/2018   Family history of autism    Hx of self-harm 09/28/2018   Supervision of high risk pregnancy, multigravida, antepartum 09/27/2018   Obesity, unspecified 09/27/2018   Hypertension 06/13/2018   Bipolar disorder, unspecified (HCC) 02/17/2017   Attention deficit  hyperactivity disorder 02/17/2017   Opiate abuse, episodic (HCC) 04/10/2015   Cannabis abuse 04/10/2015    Past Surgical History:  Procedure Laterality Date   CESAREAN SECTION     3   CESAREAN SECTION WITH BILATERAL TUBAL LIGATION Bilateral 04/18/2019   Procedure: CESAREAN SECTION WITH BILATERAL TUBAL LIGATION;  Surgeon: Alben Alma, MD;  Location: [facility] ORS;  Service: Obstetrics;  Laterality: Bilateral;   CHOLECYSTECTOMY     KIDNEY STONE SURGERY      OB History     Gravida  5   Para  4   Term  2   Preterm  2   AB  1   Living  4      SAB  1   IAB      Ectopic      Multiple  0   Live Births  1            Home Medications    Prior to Admission medications   Medication Sig Start Date End Date Taking? Authorizing Provider  buprenorphine  (SUBUTEX ) 8 MG SUBL SL tablet Place 8 mg under the tongue in the morning, at noon, and at bedtime.    [provider]  cephALEXin  (KEFLEX ) 500 MG capsule Take 1 capsule (500 mg total) by mouth 3 (three) times daily for 5 days. Patient not taking: Reported on 12/06/2023 12/02/23 12/07/23  Wellington Half, NP  ciprofloxacin  (CIPRO ) 500 MG tablet Take 1 tablet (500 mg total) by mouth 2 (two) times  daily for 3 days. 12/04/23 12/07/23  White, Maybelle Spatz, NP  LUCEMYRA 0.18 MG TABS Take by mouth. Patient not taking: Reported on 12/02/2023 12/07/22   [provider]  methocarbamol  (ROBAXIN ) 500 MG tablet Take 1 tablet (500 mg total) by mouth 4 (four) times daily. Patient not taking: Reported on 05/02/2023 03/26/22   Cuthriell, Ardath Bears, PA-C    Family History Family History  Problem Relation Age of Onset   Seizures Mother    Hypertension Mother    Hypertension Father    Cancer Paternal Grandmother        Breast    Diabetes Paternal Grandmother    Autism Son     Social History Social History   Tobacco Use   Smoking status: Every Day    Current packs/day: 0.50    Average packs/day: 0.5 packs/day for 8.0 years  (4.0 ttl pk-yrs)    Types: Cigarettes   Smokeless tobacco: Never  Vaping Use   Vaping status: Never Used  Substance Use Topics   Alcohol use: Never   Drug use: Yes    Types: Marijuana    Comment: Subutex -EVERY DAY Twice daily     Allergies   Tramadol  and Suboxone  [buprenorphine  hcl-naloxone  hcl]   Review of Systems Review of Systems  Constitutional:  Negative for chills and fever.  Gastrointestinal:  Negative for abdominal pain.  Genitourinary:  Positive for dysuria and frequency. Negative for flank pain, hematuria, pelvic pain and vaginal discharge.     Physical Exam Triage Vital Signs ED Triage Vitals  Encounter Vitals Group     BP      Systolic BP Percentile      Diastolic BP Percentile      Pulse      Resp      Temp      Temp src      SpO2      Weight      Height      Head Circumference      Peak Flow      Pain Score      Pain Loc      Pain Education      Exclude from Growth Chart    No data found.  Updated Vital Signs BP 129/79   Pulse 89   Temp 97.7 F (36.5 C)   Resp 18   LMP 11/11/2023   SpO2 98%   Visual Acuity Right Eye Distance:   Left Eye Distance:   Bilateral Distance:    Right Eye Near:   Left Eye Near:    Bilateral Near:     Physical Exam Constitutional:      General: She is not in acute distress. HENT:     Mouth/Throat:     Mouth: Mucous membranes are moist.  Cardiovascular:     Rate and Rhythm: Normal rate and regular rhythm.  Pulmonary:     Effort: Pulmonary effort is normal. No respiratory distress.  Abdominal:     General: Bowel sounds are normal.     Palpations: Abdomen is soft.     Tenderness: There is no abdominal tenderness. There is no right CVA tenderness, left CVA tenderness, guarding or rebound.  Neurological:     Mental Status: She is alert.      UC Treatments / Results  Labs (all labs ordered are listed, but only abnormal results are displayed) Labs Reviewed  POCT URINALYSIS DIP (MANUAL ENTRY) -  Abnormal; Notable for the following components:  Result Value   Clarity, UA cloudy (*)    Ketones, POC UA trace (5) (*)    Spec Grav, UA >=1.030 (*)    Protein Ur, POC =100 (*)    Leukocytes, UA Trace (*)    All other components within normal limits  URINE CULTURE  POCT URINE PREGNANCY  CERVICOVAGINAL ANCILLARY ONLY    EKG   Radiology No results found.  Procedures Procedures (including critical care time)  Medications Ordered in UC Medications - No data to display  Initial Impression / Assessment and Plan / UC Course  I have reviewed the triage vital signs and the nursing notes.  Pertinent labs & imaging results that were available during my care of the patient were reviewed by me and considered in my medical decision making (see chart for details).    Dysuria, vaginal irritation.  Afebrile and vital signs are stable.  Urine culture pending but based on the urine culture, treatment was appropriate.  Discussed with patient that we will call her if the culture shows the need for treatment.  Patient also obtained vaginal self swab for testing.  Discussed that we will call her if treatment is needed.  Instructed her to increase her water intake and to follow-up with her PCP or gynecologist.  She agrees to plan of care.  Final Clinical Impressions(s) / UC Diagnoses   Final diagnoses:  Dysuria  Vaginal irritation     Discharge Instructions      A urine culture is pending.  If it shows the need for treatment, we will call you.    Increase your water intake.   Your vaginal tests are pending.  If treatment is needed, we will call you.    Follow-up with your primary care provider or gynecologist.   ED Prescriptions   None    PDMP not reviewed this encounter.   Wellington Half, NP 12/06/23 1827

## 2023-12-06 NOTE — Discharge Instructions (Addendum)
 A urine culture is pending.  If it shows the need for treatment, we will call you.    Increase your water intake.   Your vaginal tests are pending.  If treatment is needed, we will call you.    Follow-up with your primary care provider or gynecologist.

## 2023-12-06 NOTE — ED Triage Notes (Addendum)
 Patient to Urgent Care with complaints of dysuria/ dark urine/ difficulty fully emptying her bladder/ urinary frequency/ vaginal irritation.   Symptoms started 10 days ago. Still pushing cranberry juice and water.   Completed cipro  today.

## 2023-12-07 ENCOUNTER — Ambulatory Visit: Payer: Self-pay

## 2023-12-07 LAB — CERVICOVAGINAL ANCILLARY ONLY
Bacterial Vaginitis (gardnerella): NEGATIVE
Candida Glabrata: NEGATIVE
Candida Vaginitis: NEGATIVE
Chlamydia: NEGATIVE
Comment: NEGATIVE
Comment: NEGATIVE
Comment: NEGATIVE
Comment: NEGATIVE
Comment: NEGATIVE
Comment: NORMAL
Neisseria Gonorrhea: NEGATIVE
Trichomonas: NEGATIVE

## 2023-12-07 LAB — URINE CULTURE: Culture: <10000 — AB

## 2023-12-08 ENCOUNTER — Ambulatory Visit (HOSPITAL_COMMUNITY): Payer: Self-pay

## 2024-02-19 ENCOUNTER — Ambulatory Visit
Admission: EM | Admit: 2024-02-19 | Discharge: 2024-02-19 | Disposition: A | Discharge status: Home / Self Care | Payer: MEDICAID

## 2024-02-19 DIAGNOSIS — L255 Unspecified contact dermatitis due to plants, except food: Secondary | ICD-10-CM

## 2024-02-19 MED ORDER — PREDNISONE 10 MG (21) PO TBPK
ORAL_TABLET | Freq: Every day | ORAL | 0 refills | Status: DC
Start: 2024-02-19 — End: 2024-05-30

## 2024-02-19 MED ORDER — METHYLPREDNISOLONE ACETATE 80 MG/ML IJ SUSP
60.0000 mg | Freq: Once | INTRAMUSCULAR | Status: AC
  Administered 2024-02-19: 60 mg via INTRAMUSCULAR

## 2024-02-19 NOTE — ED Triage Notes (Signed)
 Patient to Urgent Care with complaints of poison ivy rash (all over, spreading to face).    Symptoms started yesterday.

## 2024-02-19 NOTE — Discharge Instructions (Signed)
 We are treating you for poison ivy/sumac.  We gave an injection today so please do not start prednisone  until tomorrow (02/20/2024).  Do not take NSAIDs with this medication including aspirin, ibuprofen /Advil , naproxen /Aleve .  Keep the areas clean and dry to avoid scratching as this can introduce bacteria and lead to infection.  If you have any redness, swelling, drainage, fever, nausea, vomiting swelling of your throat, shortness of breath, muffled voice you need to be seen immediately.

## 2024-02-19 NOTE — ED Provider Notes (Signed)
 [name]    CSN: [id number] Arrival date & time: 02/19/24  1420      History   Chief Complaint Chief Complaint  Patient presents with   Rash    HPI Elizabeth Shea is a 33 y.o. female.   Patient presents today with a 36-hour history of widespread pruritic rash.  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  She has a history of similar reactions when exposed to poison ivy/sumac.  Denies any fever, nausea, vomiting.  Denies any additional exposures to including to plants, insects.  Denies any swelling of her throat, shortness of breath, muffled voice.  She has using over-the-counter medication including bacitracin, topical lidocaine, antihistamines without improvement of symptoms.  She denies any history of diabetes.  She is confident she is not pregnant.  She is miserable because of the itching.    Past Medical History:  Diagnosis Date   Anemia    Asthma    WELL CONTROLLED   GERD (gastroesophageal reflux disease)    History of kidney stones    Hypertension    NOT ON ANY MEDS-PCP TRYING TO FIGURE OUT IF THIS IS PIH OR HTN   Panic attack     Patient Active Problem List   Diagnosis Date Noted   Postpartum care following cesarean delivery 04/21/2019   History of 3 cesarean sections 04/18/2019   Marijuana use 03/28/2019   Family history of seizures 10/25/2018   History of cesarean delivery affecting pregnancy 10/24/2018   Previous preterm labor affecting pregnancy, antepartum 10/24/2018   Tobacco use affecting pregnancy, antepartum 10/24/2018   Family history of autism    Hx of self-harm 09/28/2018   Supervision of high risk pregnancy, multigravida, antepartum 09/27/2018   Obesity, unspecified 09/27/2018   Hypertension 06/13/2018   Bipolar disorder, unspecified (HCC) 02/17/2017   Attention deficit hyperactivity disorder 02/17/2017   Opiate abuse, episodic (HCC) 04/10/2015    Cannabis abuse 04/10/2015    Past Surgical History:  Procedure Laterality Date   CESAREAN SECTION     3   CESAREAN SECTION WITH BILATERAL TUBAL LIGATION Bilateral 04/18/2019   Procedure: CESAREAN SECTION WITH BILATERAL TUBAL LIGATION;  Surgeon: Arloa Lamar SQUIBB, MD;  Location: [facility] ORS;  Service: Obstetrics;  Laterality: Bilateral;   CHOLECYSTECTOMY     KIDNEY STONE SURGERY      OB History     Gravida  5   Para  4   Term  2   Preterm  2   AB  1   Living  4      SAB  1   IAB      Ectopic      Multiple  0   Live Births  1            Home Medications    Prior to Admission medications   Medication Sig Start Date End Date Taking? Authorizing Provider  predniSONE  (STERAPRED UNI-PAK 21 TAB) 10 MG (21) TBPK tablet Take by mouth daily. Take 6 tabs by mouth daily  for 2 days, then 5 tabs for 2 days, then 4 tabs for 2 days, then 3 tabs for 2 days, 2 tabs for 2 days, then 1 tab by mouth daily for 2 days 02/19/24  Yes Kaela Beitz K, PA-C  buprenorphine  (SUBUTEX ) 8 MG SUBL SL tablet Place 8 mg under the tongue in the morning, at noon, and at bedtime.  [provider]    Family History Family History  Problem Relation Age of Onset   Seizures Mother    Hypertension Mother    Hypertension Father    Cancer Paternal Grandmother        Breast    Diabetes Paternal Grandmother    Autism Son     Social History Social History   Tobacco Use   Smoking status: Every Day    Current packs/day: 0.50    Average packs/day: 0.5 packs/day for 8.0 years (4.0 ttl pk-yrs)    Types: Cigarettes   Smokeless tobacco: Never  Vaping Use   Vaping status: Never Used  Substance Use Topics   Alcohol use: Never   Drug use: Yes    Types: Marijuana    Comment: Subutex -EVERY DAY Twice daily     Allergies   Tramadol  and Suboxone  [buprenorphine  hcl-naloxone  hcl]   Review of Systems Review of Systems  Constitutional:  Negative for activity change, appetite change,  fatigue and fever.  HENT:  Negative for sore throat, trouble swallowing and voice change.   Respiratory:  Negative for shortness of breath.   Gastrointestinal:  Negative for nausea and vomiting.  Skin:  Positive for rash.     Physical Exam Triage Vital Signs ED Triage Vitals  Encounter Vitals Group     BP 02/19/24 1551 104/78     Girls Systolic BP Percentile --      Girls Diastolic BP Percentile --      Boys Systolic BP Percentile --      Boys Diastolic BP Percentile --      Pulse Rate 02/19/24 1551 77     Resp 02/19/24 1551 18     Temp 02/19/24 1551 98 F (36.7 C)     Temp src --      SpO2 02/19/24 1551 98 %     Weight --      Height --      Head Circumference --      Peak Flow --      Pain Score 02/19/24 1548 0     Pain Loc --      Pain Education --      Exclude from Growth Chart --    No data found.  Updated Vital Signs BP 104/78   Pulse 77   Temp 98 F (36.7 C)   Resp 18   SpO2 98%   Visual Acuity Right Eye Distance:   Left Eye Distance:   Bilateral Distance:    Right Eye Near:   Left Eye Near:    Bilateral Near:     Physical Exam Vitals reviewed.  Constitutional:      General: She is awake. She is not in acute distress.    Appearance: Normal appearance. She is well-developed. She is not ill-appearing.     Comments: Very pleasant female appears stated age in no acute distress sitting comfortably in exam room  HENT:     Head: Normocephalic and atraumatic.     Mouth/Throat:     Pharynx: Uvula midline. No oropharyngeal exudate or posterior oropharyngeal erythema.  Cardiovascular:     Rate and Rhythm: Normal rate and regular rhythm.     Heart sounds: Normal heart sounds, S1 normal and S2 normal. No murmur heard. Pulmonary:     Effort: Pulmonary effort is normal.     Breath sounds: Normal breath sounds. No wheezing, rhonchi or rales.     Comments: Clear to auscultation bilaterally Skin:    Findings:  Rash present. Rash is macular, papular and  vesicular.     Comments: Widespread maculopapular rash with areas of erythema and fine vesicles noted on lower legs, neck, bilateral forearms.  Evidence of excoriation particularly on lower legs.  Psychiatric:        Behavior: Behavior is cooperative.      UC Treatments / Results  Labs (all labs ordered are listed, but only abnormal results are displayed) Labs Reviewed - No data to display  EKG   Radiology No results found.  Procedures Procedures (including critical care time)  Medications Ordered in UC Medications  methylPREDNISolone  acetate (DEPO-MEDROL ) injection 60 mg (60 mg Intramuscular Given 02/19/24 1601)    Initial Impression / Assessment and Plan / UC Course  I have reviewed the triage vital signs and the nursing notes.  Pertinent labs & imaging results that were available during my care of the patient were reviewed by me and considered in my medical decision making (see chart for details).     Patient is well-appearing, afebrile, nontoxic, nontachycardic.  No evidence of infection that would want initiation of antibiotics.  Concern for contact dermatitis likely triggered by poison sumac.  Given widespread symptoms including involving her face will start prednisone  to manage her symptoms.  She was given Depo-Medrol  60 mg in clinic concern of prednisone  taper tomorrow (02/20/2024).  We discussed that she is not to take NSAIDs with this medication to risk of GI bleeding but can use Tylenol  and over-the-counter medicine including antihistamines to help manage the pruritus and pain.  She is to avoid scratching as this can introduce bacteria and lead to secondary infection.  We discussed that if anything worsens or changes she needs to be seen immediately.  Strict return precautions given.  All questions were answered to patient satisfaction.  Final Clinical Impressions(s) / UC Diagnoses   Final diagnoses:  Rhus dermatitis     Discharge Instructions      We are  treating you for poison ivy/sumac.  We gave an injection today so please do not start prednisone  until tomorrow (02/20/2024).  Do not take NSAIDs with this medication including aspirin, ibuprofen /Advil , naproxen /Aleve .  Keep the areas clean and dry to avoid scratching as this can introduce bacteria and lead to infection.  If you have any redness, swelling, drainage, fever, nausea, vomiting swelling of your throat, shortness of breath, muffled voice you need to be seen immediately.    ED Prescriptions     Medication Sig Dispense Auth. Provider   predniSONE  (STERAPRED UNI-PAK 21 TAB) 10 MG (21) TBPK tablet Take by mouth daily. Take 6 tabs by mouth daily  for 2 days, then 5 tabs for 2 days, then 4 tabs for 2 days, then 3 tabs for 2 days, 2 tabs for 2 days, then 1 tab by mouth daily for 2 days 42 tablet Mabry Tift K, PA-C      PDMP not reviewed this encounter.   Sherrell Rocky POUR, PA-C 02/19/24 1606

## 2024-03-02 ENCOUNTER — Ambulatory Visit: Payer: MEDICAID | Admitting: Nurse Practitioner

## 2024-03-02 NOTE — Progress Notes (Deleted)
 Leron Glance, NP-C Phone: [phone]  Elizabeth Shea is a 33 y.o. female who presents today for ***  ***  Active Ambulatory Problems    Diagnosis Date Noted   Opiate abuse, episodic (HCC) 04/10/2015   Cannabis abuse 04/10/2015   Family history of autism    History of cesarean delivery affecting pregnancy 10/24/2018   Previous preterm labor affecting pregnancy, antepartum 10/24/2018   Tobacco use affecting pregnancy, antepartum 10/24/2018   Supervision of high risk pregnancy, multigravida, antepartum 09/27/2018   Obesity, unspecified 09/27/2018   Hypertension 06/13/2018   Bipolar disorder, unspecified (HCC) 02/17/2017   Attention deficit hyperactivity disorder 02/17/2017   Hx of self-harm 09/28/2018   Family history of seizures 10/25/2018   Marijuana use 03/28/2019   History of 3 cesarean sections 04/18/2019   Postpartum care following cesarean delivery 04/21/2019   Resolved Ambulatory Problems    Diagnosis Date Noted   Acute encephalopathy 04/09/2015   First trimester screening    Past Medical History:  Diagnosis Date   Anemia    Asthma    GERD (gastroesophageal reflux disease)    History of kidney stones    Panic attack     Family History  Problem Relation Age of Onset   Seizures Mother    Hypertension Mother    Hypertension Father    Cancer Paternal Grandmother        Breast    Diabetes Paternal Grandmother    Autism Son     Social History   Socioeconomic History   Marital status: Married    Spouse name: Lonni   Number of children: Not on file   Years of education: Not on file   Highest education level: Not on file  Occupational History   Not on file  Tobacco Use   Smoking status: Every Day    Current packs/day: 0.50    Average packs/day: 0.5 packs/day for 8.0 years (4.0 ttl pk-yrs)    Types: Cigarettes   Smokeless tobacco: Never  Vaping Use   Vaping status: Never Used  Substance and Sexual Activity   Alcohol use: Never   Drug use: Yes     Types: Marijuana    Comment: Subutex -EVERY DAY Twice daily   Sexual activity: Yes  Other Topics Concern   Not on file  Social History Narrative   Not on file   Social Drivers of Health   Financial Resource Strain: Not on file  Food Insecurity: Not on file  Transportation Needs: Not on file  Physical Activity: Not on file  Stress: Not on file  Social Connections: Not on file  Intimate Partner Violence: Not on file    ROS  General:  Negative for nexplained weight loss, fever Skin: Negative for new or changing mole, sore that won't heal HEENT: Negative for trouble hearing, trouble seeing, ringing in ears, mouth sores, hoarseness, change in voice, dysphagia. CV:  Negative for chest pain, dyspnea, edema, palpitations Resp: Negative for cough, dyspnea, hemoptysis GI: Negative for nausea, vomiting, diarrhea, constipation, abdominal pain, melena, hematochezia. GU: Negative for dysuria, incontinence, urinary hesitance, hematuria, vaginal or penile discharge, polyuria, sexual difficulty, lumps in testicle or breasts MSK: Negative for muscle cramps or aches, joint pain or swelling Neuro: Negative for headaches, weakness, numbness, dizziness, passing out/fainting Psych: Negative for depression, anxiety, memory problems  Objective  Physical Exam There were no vitals filed for this visit.  BP Readings from Last 3 Encounters:  02/19/24 104/78  12/06/23 129/79  12/02/23 106/73   Wt Readings from  Last 3 Encounters:  03/26/22 193 lb (87.5 kg)  11/01/21 198 lb (89.8 kg)  12/16/19 189 lb (85.7 kg)    Physical Exam   Assessment/Plan:   There are no diagnoses linked to this encounter.  No follow-ups on file.   Leron Glance, NP-C [facility]

## 2024-05-22 ENCOUNTER — Ambulatory Visit
Admission: EM | Admit: 2024-05-22 | Discharge: 2024-05-22 | Disposition: A | Discharge status: Home / Self Care | Payer: MEDICAID | Attending: Emergency Medicine | Admitting: Emergency Medicine

## 2024-05-22 DIAGNOSIS — Z8619 Personal history of other infectious and parasitic diseases: Secondary | ICD-10-CM | POA: Diagnosis not present

## 2024-05-22 DIAGNOSIS — N390 Urinary tract infection, site not specified: Secondary | ICD-10-CM | POA: Diagnosis not present

## 2024-05-22 LAB — POCT URINE DIPSTICK
Blood, UA: NEGATIVE
Glucose, UA: 250 mg/dL — AB
Nitrite, UA: POSITIVE — AB
POC PROTEIN,UA: 100 — AB
Spec Grav, UA: 1.02 (ref 1.010–1.025)
Urobilinogen, UA: 4 U/dL — AB
pH, UA: 5 (ref 5.0–8.0)

## 2024-05-22 LAB — POCT URINE PREGNANCY: Preg Test, Ur: NEGATIVE

## 2024-05-22 MED ORDER — FLUCONAZOLE 150 MG PO TABS: 150.0000 mg | ORAL_TABLET | Freq: Once | ORAL | 0 refills | Status: AC

## 2024-05-22 MED ORDER — CEPHALEXIN 500 MG PO CAPS: 500.0000 mg | ORAL_CAPSULE | Freq: Three times a day (TID) | ORAL | 0 refills | Status: AC

## 2024-05-22 NOTE — Discharge Instructions (Addendum)
 Take the Keflex  as directed.  The urine culture is pending.  We will call you if it shows the need to change or discontinue your antibiotic.    Take the Diflucan  as directed.    Follow-up with your primary care provider if your symptoms are not improving.

## 2024-05-22 NOTE — ED Provider Notes (Signed)
 Elizabeth Shea    CSN: [id number] Arrival date & time: 05/22/24  1705      History   Chief Complaint Chief Complaint  Patient presents with   Dysuria    HPI Elizabeth Shea is a 33 y.o. female.  Patient presents with 3-day history of dysuria, urinary frequency, urinary urgency.  No fever, abdominal pain, hematuria, flank pain.  She has been treating her symptoms with Azo.  The history is provided by the patient and medical records.    Past Medical History:  Diagnosis Date   Anemia    Asthma    WELL CONTROLLED   GERD (gastroesophageal reflux disease)    History of kidney stones    Hypertension    NOT ON ANY MEDS-PCP TRYING TO FIGURE OUT IF THIS IS PIH OR HTN   Panic attack     Patient Active Problem List   Diagnosis Date Noted   Postpartum care following cesarean delivery 04/21/2019   History of 3 cesarean sections 04/18/2019   Marijuana use 03/28/2019   Family history of seizures 10/25/2018   History of cesarean delivery affecting pregnancy 10/24/2018   Previous preterm labor affecting pregnancy, antepartum 10/24/2018   Tobacco use affecting pregnancy, antepartum 10/24/2018   Family history of autism    Hx of self-harm 09/28/2018   Supervision of high risk pregnancy, multigravida, antepartum 09/27/2018   Obesity, unspecified 09/27/2018   Hypertension 06/13/2018   Bipolar disorder, unspecified (HCC) 02/17/2017   Attention deficit hyperactivity disorder 02/17/2017   Opiate abuse, episodic (HCC) 04/10/2015   Cannabis abuse 04/10/2015    Past Surgical History:  Procedure Laterality Date   CESAREAN SECTION     3   CESAREAN SECTION WITH BILATERAL TUBAL LIGATION Bilateral 04/18/2019   Procedure: CESAREAN SECTION WITH BILATERAL TUBAL LIGATION;  Surgeon: Arloa Lamar SQUIBB, MD;  Location: [facility] ORS;  Service: Obstetrics;  Laterality: Bilateral;   CHOLECYSTECTOMY     KIDNEY STONE SURGERY      OB History     Gravida  5   Para  4   Term  2   Preterm  2    AB  1   Living  4      SAB  1   IAB      Ectopic      Multiple  0   Live Births  1            Home Medications    Prior to Admission medications   Medication Sig Start Date End Date Taking? Authorizing Provider  cephALEXin  (KEFLEX ) 500 MG capsule Take 1 capsule (500 mg total) by mouth 3 (three) times daily for 5 days. 05/22/24 05/27/24 Yes Corlis Burnard DEL, NP  fluconazole  (DIFLUCAN ) 150 MG tablet Take 1 tablet (150 mg total) by mouth once for 1 dose. 05/22/24 05/22/24 Yes Corlis Burnard DEL, NP  buprenorphine  (SUBUTEX ) 8 MG SUBL SL tablet Place 8 mg under the tongue in the morning, at noon, and at bedtime.    [provider]  predniSONE  (STERAPRED UNI-PAK 21 TAB) 10 MG (21) TBPK tablet Take by mouth daily. Take 6 tabs by mouth daily  for 2 days, then 5 tabs for 2 days, then 4 tabs for 2 days, then 3 tabs for 2 days, 2 tabs for 2 days, then 1 tab by mouth daily for 2 days Patient not taking: Reported on 05/22/2024 02/19/24   Raspet, Rocky POUR, PA-C    Family History Family History  Problem Relation Age of Onset  Seizures Mother    Hypertension Mother    Hypertension Father    Cancer Paternal Grandmother        Breast    Diabetes Paternal Grandmother    Autism Son     Social History Social History   Tobacco Use   Smoking status: Every Day    Current packs/day: 0.50    Average packs/day: 0.5 packs/day for 8.0 years (4.0 ttl pk-yrs)    Types: Cigarettes   Smokeless tobacco: Never  Vaping Use   Vaping status: Never Used  Substance Use Topics   Alcohol use: Never   Drug use: Yes    Types: Marijuana    Comment: Subutex -EVERY DAY Twice daily     Allergies   Tramadol  and Suboxone  [buprenorphine  hcl-naloxone  hcl]   Review of Systems Review of Systems  Constitutional:  Negative for chills and fever.  Gastrointestinal:  Negative for abdominal pain.  Genitourinary:  Positive for dysuria, frequency and urgency. Negative for flank pain and hematuria.      Physical Exam Triage Vital Signs ED Triage Vitals [05/22/24 1824]  Encounter Vitals Group     BP 111/75     Girls Systolic BP Percentile      Girls Diastolic BP Percentile      Boys Systolic BP Percentile      Boys Diastolic BP Percentile      Pulse Rate 62     Resp 18     Temp 97.8 F (36.6 C)     Temp src      SpO2 97 %     Weight      Height      Head Circumference      Peak Flow      Pain Score      Pain Loc      Pain Education      Exclude from Growth Chart    No data found.  Updated Vital Signs BP 111/75   Pulse 62   Temp 97.8 F (36.6 C)   Resp 18   SpO2 97%   Visual Acuity Right Eye Distance:   Left Eye Distance:   Bilateral Distance:    Right Eye Near:   Left Eye Near:    Bilateral Near:     Physical Exam Constitutional:      General: She is not in acute distress. HENT:     Mouth/Throat:     Mouth: Mucous membranes are moist.  Cardiovascular:     Rate and Rhythm: Normal rate.  Pulmonary:     Effort: Pulmonary effort is normal. No respiratory distress.  Abdominal:     General: Bowel sounds are normal.     Palpations: Abdomen is soft.     Tenderness: There is no abdominal tenderness. There is no right CVA tenderness, left CVA tenderness, guarding or rebound.  Neurological:     Mental Status: She is alert.      UC Treatments / Results  Labs (all labs ordered are listed, but only abnormal results are displayed) Labs Reviewed  POCT URINE DIPSTICK - Abnormal; Notable for the following components:      Result Value   Color, UA red (*)    Glucose, UA =250 (*)    Bilirubin, UA moderate (*)    Ketones, POC UA small (15) (*)    POC PROTEIN,UA =100 (*)    Urobilinogen, UA 4.0 (*)    Nitrite, UA Positive (*)    Leukocytes, UA Large (3+) (*)  All other components within normal limits  URINE CULTURE  POCT URINE PREGNANCY    EKG   Radiology No results found.  Procedures Procedures (including critical care time)  Medications  Ordered in UC Medications - No data to display  Initial Impression / Assessment and Plan / UC Course  I have reviewed the triage vital signs and the nursing notes.  Pertinent labs & imaging results that were available during my care of the patient were reviewed by me and considered in my medical decision making (see chart for details).    Urinary tract infection, history of vaginal yeast infection when taking antibiotics.  Afebrile and vital signs are stable.  Treating with Keflex . Urine culture pending. Discussed with patient that we will call her if the urine culture shows the need to change or discontinue the antibiotic.  Also prescribed 1 dose of Diflucan  as patient reports history of vaginal yeast infection when taking antibiotics.  Instructed her to follow-up with her PCP if her symptoms are not improving. Patient agrees to plan of care.     Final Clinical Impressions(s) / UC Diagnoses   Final diagnoses:  Urinary tract infection without hematuria, site unspecified  History of candidiasis of vagina     Discharge Instructions      Take the Keflex  as directed.  The urine culture is pending.  We will call you if it shows the need to change or discontinue your antibiotic.    Take the Diflucan  as directed.    Follow-up with your primary care provider if your symptoms are not improving.      ED Prescriptions     Medication Sig Dispense Auth. Provider   cephALEXin  (KEFLEX ) 500 MG capsule Take 1 capsule (500 mg total) by mouth 3 (three) times daily for 5 days. 15 capsule Corlis Burnard DEL, NP   fluconazole  (DIFLUCAN ) 150 MG tablet Take 1 tablet (150 mg total) by mouth once for 1 dose. 1 tablet Corlis Burnard DEL, NP      PDMP not reviewed this encounter.   Corlis Burnard DEL, NP 05/22/24 1850

## 2024-05-22 NOTE — ED Triage Notes (Signed)
 Patient to Urgent Care with complaints of urinary frequency and urgency/ dysuria.   Symptoms x3 days.   Taking azo.   Requests diflucan  if prescribed abx.

## 2024-05-23 LAB — URINE CULTURE: Culture: NO GROWTH

## 2024-05-24 ENCOUNTER — Ambulatory Visit (HOSPITAL_COMMUNITY): Payer: Self-pay

## 2024-05-26 MED ORDER — FLUCONAZOLE 150 MG PO TABS: 150.0000 mg | ORAL_TABLET | Freq: Once | ORAL | 0 refills | Status: AC

## 2024-05-28 ENCOUNTER — Ambulatory Visit
Admission: RE | Admit: 2024-05-28 | Discharge: 2024-05-28 | Disposition: A | Discharge status: Home / Self Care | Payer: MEDICAID | Source: Ambulatory Visit | Attending: Emergency Medicine | Admitting: Emergency Medicine

## 2024-05-28 VITALS — BP 106/74 | HR 64 | Temp 97.7°F | Resp 18

## 2024-05-28 DIAGNOSIS — R300 Dysuria: Secondary | ICD-10-CM | POA: Diagnosis present

## 2024-05-28 DIAGNOSIS — N898 Other specified noninflammatory disorders of vagina: Secondary | ICD-10-CM | POA: Insufficient documentation

## 2024-05-28 LAB — POCT URINE DIPSTICK
Bilirubin, UA: NEGATIVE
Blood, UA: NEGATIVE
Glucose, UA: NEGATIVE mg/dL
Ketones, POC UA: NEGATIVE mg/dL
Leukocytes, UA: NEGATIVE
Nitrite, UA: NEGATIVE
Protein Ur, POC: 30 mg/dL — AB
Spec Grav, UA: >=1.03 — AB (ref 1.010–1.025)
Urobilinogen, UA: 0.2 U/dL
pH, UA: 6 (ref 5.0–8.0)

## 2024-05-28 MED ORDER — METRONIDAZOLE 500 MG PO TABS: 500.0000 mg | ORAL_TABLET | Freq: Two times a day (BID) | ORAL | 0 refills | Status: DC

## 2024-05-28 NOTE — Discharge Instructions (Addendum)
 Your urine does not show signs of infection today.    Take the metronidazole as directed.  Your vaginal test are pending.  Your test results will be available on MyChart.  We will call if treatment change is needed.  Do not have any sexual activity for the next 7 days.    Follow-up with your gynecologist or primary care provider.

## 2024-05-28 NOTE — ED Triage Notes (Signed)
 Patient to Urgent Care with complaints of persistent urinary frequency/ dysuria/ intermittent burning.  Symptoms x10 days. Meds: prescribed abx at previous visit but d/c d/t negative urine culture. Took azo last night.   Requests STD testing on vaginal swab.

## 2024-05-28 NOTE — ED Provider Notes (Signed)
 Elizabeth Shea    CSN: [id number] Arrival date & time: 05/28/24  1453      History   Chief Complaint Chief Complaint  Patient presents with   Urinary Frequency    Already had a visit. Medication is not working they called and said it was not a UTI it could be something else so if the medicine didn't work come back it didn't work - Entered by patient    HPI Elizabeth Shea is a 33 y.o. female.  Patient presents with 10-day history of dysuria and urinary frequency.  She has developed vaginal discharge and uncomfortable throbbing of her clitoris in the last couple of days.  Her symptoms started after contact with new sexual toy and game with her husband.  She is concerned for possible bacterial vaginitis.  No fever, rash, sores, pelvic pain, hematuria, flank pain, abdominal pain.  She took Azo last night; none today.  Patient was seen here on 05/22/2024; diagnosed with a UTI and history of vaginal yeast infections; treated with cephalexin  and 1 dose of fluconazole ; urine culture subsequently came back with no growth and patient instructed to stop antibiotic.  The history is provided by the patient and medical records.    Past Medical History:  Diagnosis Date   Anemia    Asthma    WELL CONTROLLED   GERD (gastroesophageal reflux disease)    History of kidney stones    Hypertension    NOT ON ANY MEDS-PCP TRYING TO FIGURE OUT IF THIS IS PIH OR HTN   Panic attack     Patient Active Problem List   Diagnosis Date Noted   Postpartum care following cesarean delivery 04/21/2019   History of 3 cesarean sections 04/18/2019   Marijuana use 03/28/2019   Family history of seizures 10/25/2018   History of cesarean delivery affecting pregnancy 10/24/2018   Previous preterm labor affecting pregnancy, antepartum 10/24/2018   Tobacco use affecting pregnancy, antepartum 10/24/2018   Family history of autism    Hx of self-harm 09/28/2018   Supervision of high risk pregnancy, multigravida,  antepartum 09/27/2018   Obesity, unspecified 09/27/2018   Hypertension 06/13/2018   Bipolar disorder, unspecified (HCC) 02/17/2017   Attention deficit hyperactivity disorder 02/17/2017   Opiate abuse, episodic (HCC) 04/10/2015   Cannabis abuse 04/10/2015    Past Surgical History:  Procedure Laterality Date   CESAREAN SECTION     3   CESAREAN SECTION WITH BILATERAL TUBAL LIGATION Bilateral 04/18/2019   Procedure: CESAREAN SECTION WITH BILATERAL TUBAL LIGATION;  Surgeon: Arloa Lamar SQUIBB, MD;  Location: [facility] ORS;  Service: Obstetrics;  Laterality: Bilateral;   CHOLECYSTECTOMY     KIDNEY STONE SURGERY      OB History     Gravida  5   Para  4   Term  2   Preterm  2   AB  1   Living  4      SAB  1   IAB      Ectopic      Multiple  0   Live Births  1            Home Medications    Prior to Admission medications   Medication Sig Start Date End Date Taking? Authorizing Provider  metroNIDAZOLE (FLAGYL) 500 MG tablet Take 1 tablet (500 mg total) by mouth 2 (two) times daily. 05/28/24  Yes Corlis Burnard DEL, NP  buprenorphine  (SUBUTEX ) 8 MG SUBL SL tablet Place 8 mg under the tongue in the  morning, at noon, and at bedtime.    [provider]  predniSONE  (STERAPRED UNI-PAK 21 TAB) 10 MG (21) TBPK tablet Take by mouth daily. Take 6 tabs by mouth daily  for 2 days, then 5 tabs for 2 days, then 4 tabs for 2 days, then 3 tabs for 2 days, 2 tabs for 2 days, then 1 tab by mouth daily for 2 days Patient not taking: Reported on 05/22/2024 02/19/24   Raspet, Rocky POUR, PA-C    Family History Family History  Problem Relation Age of Onset   Seizures Mother    Hypertension Mother    Hypertension Father    Cancer Paternal Grandmother        Breast    Diabetes Paternal Grandmother    Autism Son     Social History Social History   Tobacco Use   Smoking status: Every Day    Current packs/day: 0.50    Average packs/day: 0.5 packs/day for 8.0 years (4.0 ttl pk-yrs)     Types: Cigarettes   Smokeless tobacco: Never  Vaping Use   Vaping status: Never Used  Substance Use Topics   Alcohol use: Never   Drug use: Yes    Types: Marijuana    Comment: Subutex -EVERY DAY Twice daily     Allergies   Tramadol  and Suboxone  [buprenorphine  hcl-naloxone  hcl]   Review of Systems Review of Systems  Constitutional:  Negative for chills and fever.  Gastrointestinal:  Negative for abdominal pain, diarrhea, nausea and vomiting.  Genitourinary:  Positive for dysuria, frequency and vaginal discharge. Negative for flank pain, hematuria and pelvic pain.  Skin:  Negative for color change, rash and wound.     Physical Exam Triage Vital Signs ED Triage Vitals  Encounter Vitals Group     BP      Girls Systolic BP Percentile      Girls Diastolic BP Percentile      Boys Systolic BP Percentile      Boys Diastolic BP Percentile      Pulse      Resp      Temp      Temp src      SpO2      Weight      Height      Head Circumference      Peak Flow      Pain Score      Pain Loc      Pain Education      Exclude from Growth Chart    No data found.  Updated Vital Signs BP 106/74   Pulse 64   Temp 97.7 F (36.5 C)   Resp 18   SpO2 98%   Visual Acuity Right Eye Distance:   Left Eye Distance:   Bilateral Distance:    Right Eye Near:   Left Eye Near:    Bilateral Near:     Physical Exam Constitutional:      General: She is not in acute distress. HENT:     Mouth/Throat:     Mouth: Mucous membranes are moist.  Cardiovascular:     Rate and Rhythm: Normal rate.  Pulmonary:     Effort: Pulmonary effort is normal. No respiratory distress.  Abdominal:     General: Bowel sounds are normal.     Palpations: Abdomen is soft.     Tenderness: There is no abdominal tenderness. There is no right CVA tenderness, left CVA tenderness, guarding or rebound.  Genitourinary:    Comments: Patient  declines GU exam. Neurological:     Mental Status: She is alert.       UC Treatments / Results  Labs (all labs ordered are listed, but only abnormal results are displayed) Labs Reviewed  POCT URINE DIPSTICK - Abnormal; Notable for the following components:      Result Value   Spec Grav, UA >=1.030 (*)    Protein Ur, POC =30 (*)    All other components within normal limits  CERVICOVAGINAL ANCILLARY ONLY    EKG   Radiology No results found.  Procedures Procedures (including critical care time)  Medications Ordered in UC Medications - No data to display  Initial Impression / Assessment and Plan / UC Course  I have reviewed the triage vital signs and the nursing notes.  Pertinent labs & imaging results that were available during my care of the patient were reviewed by me and considered in my medical decision making (see chart for details).    Dysuria, vaginal discharge.  Urine does not show signs of infection today.  Patient obtained vaginal self swab for testing.  Discussed that all test results will be available in MyChart and we will call if change in treatment is needed.  Instructed patient to abstain from sexual activity x 7 days.  Instructed her to follow-up with her PCP or gynecologist.  She agrees to plan of care.   Final Clinical Impressions(s) / UC Diagnoses   Final diagnoses:  Dysuria  Vaginal discharge     Discharge Instructions      Your urine does not show signs of infection today.    Take the metronidazole as directed.  Your vaginal test are pending.  Your test results will be available on MyChart.  We will call if treatment change is needed.  Do not have any sexual activity for the next 7 days.    Follow-up with your gynecologist or primary care provider.     ED Prescriptions     Medication Sig Dispense Auth. Provider   metroNIDAZOLE (FLAGYL) 500 MG tablet Take 1 tablet (500 mg total) by mouth 2 (two) times daily. 14 tablet Corlis Burnard DEL, NP      PDMP not reviewed this encounter.   Corlis Burnard DEL,  NP 05/28/24 [phone]

## 2024-05-29 LAB — CERVICOVAGINAL ANCILLARY ONLY
Bacterial Vaginitis (gardnerella): NEGATIVE
Candida Glabrata: NEGATIVE
Candida Vaginitis: NEGATIVE
Chlamydia: NEGATIVE
Comment: NEGATIVE
Comment: NEGATIVE
Comment: NEGATIVE
Comment: NEGATIVE
Comment: NEGATIVE
Comment: NORMAL
Neisseria Gonorrhea: NEGATIVE
Trichomonas: NEGATIVE

## 2024-05-30 ENCOUNTER — Ambulatory Visit (INDEPENDENT_AMBULATORY_CARE_PROVIDER_SITE_OTHER): Payer: MEDICAID | Admitting: Family Medicine

## 2024-05-30 VITALS — BP 111/77 | HR 87 | Resp 16 | Ht 63.0 in | Wt 189.6 lb

## 2024-05-30 DIAGNOSIS — N921 Excessive and frequent menstruation with irregular cycle: Secondary | ICD-10-CM

## 2024-05-30 DIAGNOSIS — L678 Other hair color and hair shaft abnormalities: Secondary | ICD-10-CM | POA: Diagnosis not present

## 2024-05-30 DIAGNOSIS — Z7689 Persons encountering health services in other specified circumstances: Secondary | ICD-10-CM

## 2024-05-30 DIAGNOSIS — J3089 Other allergic rhinitis: Secondary | ICD-10-CM | POA: Diagnosis not present

## 2024-05-30 DIAGNOSIS — Z0000 Encounter for general adult medical examination without abnormal findings: Secondary | ICD-10-CM | POA: Diagnosis not present

## 2024-05-30 NOTE — Patient Instructions (Signed)
 MyChart:  For all urgent or time sensitive needs we ask that you please call the office to avoid delays. Our number is [phone]) [phone]. MyChart is not constantly monitored and due to the large volume of messages a day, replies may take up to 72 business hours.   MyChart Policy: MyChart allows for you to see your visit notes, after visit summary, provider recommendations, lab and tests results, make an appointment, request refills, and contact your provider or the office for non-urgent questions or concerns. Providers are seeing patients during normal business hours and do not have built in time to review MyChart messages.  We ask that you allow a minimum of 3 business days for responses to [employer]. For this reason, please do not send urgent requests through MyChart. Please call the office at [phone]. New and ongoing conditions may require a visit. We have virtual and in person visit available for your convenience.  Complex MyChart concerns may require a visit. Your provider may request you schedule a virtual or in person visit to ensure we are providing the best care possible. MyChart messages sent after 11:00 AM on Friday will not be received by the provider until Monday morning.    Lab and Test Results: You will receive your lab and test results on MyChart as soon as they are completed and results have been sent by the lab or testing facility. Due to this service, you will receive your results BEFORE your provider.  I review lab and tests results each morning prior to seeing patients. Some results require collaboration with other providers to ensure you are receiving the most appropriate care. For this reason, we ask that you please allow a minimum of 3-5 business days from the time the ALL results have been received for your provider to receive and review lab and test results and contact you about these.  Most lab and test result comments from the provider will be sent through  MyChart. Your provider may recommend changes to the plan of care, follow-up visits, repeat testing, ask questions, or request an office visit to discuss these results. You may reply directly to this message or call the office at [phone] to provide information for the provider or set up an appointment. In some instances, you will be called with test results and recommendations. Please let us  know if this is preferred and we will make note of this in your chart to provide this for you.    If you have not heard a response to your lab or test results in 5 business days from all results returning to MyChart, please call the office to let us  know. We ask that you please avoid calling prior to this time unless there is an emergent concern. Due to high call volumes, this can delay the resulting process.   After Hours: For all non-emergency after hours needs, please call the office at [phone] and select the option to reach the on-call provider service. On-call services are shared between multiple [place] offices and therefore it will not be possible to speak directly with your provider. On-call providers may provide medical advice and recommendations, but are unable to provide refills for maintenance medications.  For all emergency or urgent medical needs after normal business hours, we recommend that you seek care at the closest Urgent Care or Emergency Department to ensure appropriate treatment in a timely manner.  [facility] [place] has a 24 hour emergency room located on the ground  floor for your convenience.    Urgent Concerns During the Business Day Providers are seeing patients from 8AM to 5PM, Monday through Thursday, and 8AM to 12PM on Friday with a busy schedule and are most often not able to respond to non-urgent calls until the end of the day or the next business day. If you should have URGENT concerns during the day, please call and speak to the nurse or schedule a  same day appointment so that we can address your concern without delay.    Thank you, again, for choosing me as your health care partner. I appreciate your trust and look forward to learning more about you.    Elizabeth Arts, FNP-C

## 2024-05-30 NOTE — Progress Notes (Signed)
 New Patient Office Visit  Subjective   Patient ID: Elizabeth Shea, female    DOB: [birth year]  Age: 33 y.o. MRN: [id number]  CC:  Chief Complaint  Patient presents with   Establish Care   Discussed the use of AI scribe software for clinical note transcription with the patient, who gave verbal consent to proceed.  History of Present Illness   Elizabeth Shea is a 33 year old female who presents to establish with [facility] at [facility]. She has concerns mainly about urinary symptoms and menstrual irregularities.  She recently visited urgent care for urinary symptoms. She reported that the in-office test for UTI was positive, but a subsequent culture was negative. She took Azo, which may have affected the culture results, and completed a course of antibiotics. The urinary symptoms resolved, but she developed a discharge, leading to treatment for a yeast infection. Despite treatment, she continues to experience a burning sensation when wiping, described as a 'light burn' or discomfort, and a 'thumping' sensation in the clitoral area, which has since resolved. She is very sensitive to touch and cannot use perfumes or lotions. Tests for bacterial vaginosis, yeast, UTI, gonorrhea, herpes, and chlamydia were all negative.  She reports menstrual irregularities with heavy periods, large clots, and severe cramping. Her cycles have become irregular, occurring every 20 to 30 days, and she experiences significant pain requiring frequent ibuprofen  use, which causes nausea. She notes a change in her menstrual cycle pattern and describes ovulation symptoms as a 'gooey' discharge.  She has concerns about polycystic ovary syndrome (PCOS) due to coarse hair growth, particularly on her face, which began after her last child. She experiences acne and irregular cycles. She recently started taking an over-the-counter supplement for high testosterone levels, which she reports has improved her mood, energy, and  reduced hair growth.  She expresses interest in allergy testing due to symptoms of watery, itchy eyes, frequent sneezing, and a runny nose. She has a history of being allergic to cats but continues to have them as pets. She occasionally takes Benadryl  for relief.  Her family history includes breast cancer in her grandmother, who underwent a mastectomy. She has signed up to complete genetic testing through MyChart and is awaiting for further instructions. Her mother and sister have thyroid issues, though her previous thyroid tests were normal.      Outpatient Encounter Medications as of 05/30/2024  Medication Sig   buprenorphine  (SUBUTEX ) 8 MG SUBL SL tablet Place 8 mg under the tongue in the morning, at noon, and at bedtime.   gabapentin  (NEURONTIN ) 300 MG capsule Take 300 mg by mouth 3 (three) times daily.   Multiple Vitamins-Minerals (AZO HORMONAL HEALTH CYCLE CARE) TABS Take by mouth.   [DISCONTINUED] ondansetron  (ZOFRAN -ODT) 4 MG disintegrating tablet Take 2 mg by mouth every 6 (six) hours as needed.   [DISCONTINUED] fluconazole  (DIFLUCAN ) 150 MG tablet Take 150 mg by mouth once. (Patient not taking: Reported on 05/30/2024)   [DISCONTINUED] metroNIDAZOLE (FLAGYL) 500 MG tablet Take 1 tablet (500 mg total) by mouth 2 (two) times daily. (Patient not taking: Reported on 05/30/2024)   [DISCONTINUED] predniSONE  (STERAPRED UNI-PAK 21 TAB) 10 MG (21) TBPK tablet Take by mouth daily. Take 6 tabs by mouth daily  for 2 days, then 5 tabs for 2 days, then 4 tabs for 2 days, then 3 tabs for 2 days, 2 tabs for 2 days, then 1 tab by mouth daily for 2 days (Patient not taking: Reported on 05/30/2024)  No facility-administered encounter medications on file as of 05/30/2024.   Patient Active Problem List   Diagnosis Date Noted   Postpartum care following cesarean delivery 04/21/2019   History of 3 cesarean sections 04/18/2019   Marijuana use 03/28/2019   Family history of seizures 10/25/2018   History of  cesarean delivery affecting pregnancy 10/24/2018   Previous preterm labor affecting pregnancy, antepartum 10/24/2018   Tobacco use affecting pregnancy, antepartum 10/24/2018   Family history of autism    Hx of self-harm 09/28/2018   Supervision of high risk pregnancy, multigravida, antepartum 09/27/2018   Obesity, unspecified 09/27/2018   Hypertension 06/13/2018   Bipolar disorder, unspecified (HCC) 02/17/2017   Attention deficit hyperactivity disorder 02/17/2017   Opiate abuse, episodic (HCC) 04/10/2015   Cannabis abuse 04/10/2015   Past Medical History:  Diagnosis Date   Anemia    Asthma    WELL CONTROLLED   GERD (gastroesophageal reflux disease)    History of kidney stones    Hypertension    NOT ON ANY MEDS-PCP TRYING TO FIGURE OUT IF THIS IS PIH OR HTN   Panic attack    Past Surgical History:  Procedure Laterality Date   CESAREAN SECTION     3   CESAREAN SECTION WITH BILATERAL TUBAL LIGATION Bilateral 04/18/2019   Procedure: CESAREAN SECTION WITH BILATERAL TUBAL LIGATION;  Surgeon: Arloa Lamar SQUIBB, MD;  Location: [facility] ORS;  Service: Obstetrics;  Laterality: Bilateral;   CHOLECYSTECTOMY     KIDNEY STONE SURGERY     Family History  Problem Relation Age of Onset   Seizures Mother    Hypertension Mother    Hypertension Father    Cancer Paternal Grandmother        Breast    Diabetes Paternal Grandmother    Autism Son    Social History   Socioeconomic History   Marital status: Married    Spouse name: Lonni   Number of children: Not on file   Years of education: Not on file   Highest education level: 9th grade  Occupational History   Not on file  Tobacco Use   Smoking status: Every Day    Current packs/day: 0.50    Average packs/day: 0.5 packs/day for 8.0 years (4.0 ttl pk-yrs)    Types: Cigarettes   Smokeless tobacco: Never  Vaping Use   Vaping status: Never Used  Substance and Sexual Activity   Alcohol use: Never   Drug use: Yes    Types: Marijuana     Comment: Subutex -EVERY DAY Twice daily   Sexual activity: Yes  Other Topics Concern   Not on file  Social History Narrative   Not on file   Social Drivers of Health   Financial Resource Strain: Medium Risk (05/30/2024)   Overall Financial Resource Strain (CARDIA)    Difficulty of Paying Living Expenses: Somewhat hard  Food Insecurity: Food Insecurity Present (05/30/2024)   Hunger Vital Sign    Worried About Running Out of Food in the Last Year: Sometimes true    Ran Out of Food in the Last Year: Never true  Transportation Needs: No Transportation Needs (05/30/2024)   PRAPARE - Administrator, Civil Service (Medical): No    Lack of Transportation (Non-Medical): No  Physical Activity: Insufficiently Active (05/30/2024)   Exercise Vital Sign    Days of Exercise per Week: 2 days    Minutes of Exercise per Session: 20 min  Stress: Stress Concern Present (05/30/2024)   [employer] of Occupational Health -  Occupational Stress Questionnaire    Feeling of Stress: To some extent  Social Connections: Moderately Integrated (05/30/2024)   Social Connection and Isolation Panel    Frequency of Communication with Friends and Family: More than three times a week    Frequency of Social Gatherings with Friends and Family: Twice a week    Attends Religious Services: More than 4 times per year    Active Member of [employer] or Organizations: No    Attends Engineer, Structural: Not on file    Marital Status: Married  Catering Manager Violence: Not on file   Outpatient Medications Prior to Visit  Medication Sig Dispense Refill   buprenorphine  (SUBUTEX ) 8 MG SUBL SL tablet Place 8 mg under the tongue in the morning, at noon, and at bedtime.     gabapentin  (NEURONTIN ) 300 MG capsule Take 300 mg by mouth 3 (three) times daily.     Multiple Vitamins-Minerals (AZO HORMONAL HEALTH CYCLE CARE) TABS Take by mouth.     ondansetron  (ZOFRAN -ODT) 4 MG disintegrating tablet Take 2 mg by  mouth every 6 (six) hours as needed.     fluconazole  (DIFLUCAN ) 150 MG tablet Take 150 mg by mouth once. (Patient not taking: Reported on 05/30/2024)     metroNIDAZOLE (FLAGYL) 500 MG tablet Take 1 tablet (500 mg total) by mouth 2 (two) times daily. (Patient not taking: Reported on 05/30/2024) 14 tablet 0   predniSONE  (STERAPRED UNI-PAK 21 TAB) 10 MG (21) TBPK tablet Take by mouth daily. Take 6 tabs by mouth daily  for 2 days, then 5 tabs for 2 days, then 4 tabs for 2 days, then 3 tabs for 2 days, 2 tabs for 2 days, then 1 tab by mouth daily for 2 days (Patient not taking: Reported on 05/30/2024) 42 tablet 0   No facility-administered medications prior to visit.   Allergies  Allergen Reactions   Tramadol  Hives   Suboxone  [Buprenorphine  Hcl-Naloxone  Hcl]     Strips   ROS: see HPI    Objective  Today's Vitals   05/30/24 1529  BP: 111/77  Pulse: 87  Resp: 16  Weight: 189 lb 9.6 oz (86 kg)  Height: 5' 3 (1.6 m)  PainSc: 0-No pain   GENERAL: Well-appearing, in NAD. Well nourished.  SKIN: Pink, warm and dry. No rash, lesion, ulceration, or ecchymoses.  Head: Normocephalic. NECK: Trachea midline. Full ROM w/o pain or tenderness. No lymphadenopathy.  EARS: Tympanic membranes are intact, translucent without bulging and without drainage. Appropriate landmarks visualized.  EYES: Conjunctiva clear without exudates. EOMI, PERRL, no drainage present.  NOSE: Septum midline w/o deformity. Nares patent, mucosa pink and non-inflamed w/o drainage. No sinus tenderness.  THROAT: Uvula midline. Oropharynx clear. Tonsils non-inflamed without exudate. Mucous membranes pink and moist.  RESPIRATORY: Chest wall symmetrical. Respirations even and non-labored. Breath sounds clear to auscultation bilaterally.  CARDIAC: S1, S2 present, regular rate and rhythm without murmur or gallops. Peripheral pulses 2+ bilaterally.  MSK: Muscle tone and strength appropriate for age. Joints w/o tenderness, redness, or swelling.   EXTREMITIES: Without clubbing, cyanosis, or edema.  NEUROLOGIC: No motor or sensory deficits. Steady, even gait. C2-C12 intact.  PSYCH/MENTAL STATUS: Alert, oriented x 3. Cooperative, appropriate mood and affect.   Assessment & Plan:   1. Encounter to establish care (Primary) Patient is a 33 year old female who presents today to establish care with primary care at [facility]. Reviewed the past medical history, family history, social history, surgical history, medications and allergies today- updates made as indicated.  Patient has concerns today about possible PCOS and lab work.   2. Abnormal facial hair See #3 - DHEA-sulfate; Future - Testosterone,Free and Total; Future - 17-Hydroxyprogesterone; Future - FSH/LH; Future  3. Menorrhagia with irregular cycle Irregular cycles with heavy bleeding and large clots. Possible PCOS due to irregular cycles, hirsutism, and acne. Family history of thyroid issues. Discussed potential benefits of birth control for cycle regulation and hormone stabilization. Ordered hormone level testing to evaluate for PCOS. Discussed potential use of birth control for cycle regulation- will obtain blood work first.  - DHEA-sulfate; Future - Testosterone,Free and Total; Future - 17-Hydroxyprogesterone; Future - FSH/LH; Future  4. Environmental and seasonal allergies Chronic symptoms of watery eyes, itching, sneezing, and runny nose. Previous testing showed cat allergy, but she has cats at home. Referred to allergist for comprehensive allergy testing.  - Ambulatory referral to Allergy  5. Healthcare maintenance Establishing care after a long period without primary care. Recent urinary symptoms treated as UTI, but culture negative. Persistent burning possibly due to medication sensitivity. Negative for bacterial vaginosis, yeast infection, or STIs. Family history of breast cancer and thyroid issues. Recent genetic testing initiated. Concerns about potential PCOS due to  irregular cycles, heavy menstruation, and hirsutism. Ordered fasting labs including hormone levels, thyroid function, and blood count. - CBC with Differential/Platelet; Future - Comprehensive metabolic panel with GFR; Future - Hemoglobin A1c; Future - Lipid panel; Future - TSH Rfx on Abnormal to Free T4; Future    Return in about 4 weeks (around 06/27/2024) for Physical & pap smear with fasting labs .   Evalene Arts, FNP

## 2024-05-31 ENCOUNTER — Other Ambulatory Visit: Payer: Self-pay | Admitting: Medical Genetics

## 2024-05-31 ENCOUNTER — Encounter: Payer: Self-pay | Admitting: Family Medicine

## 2024-06-08 ENCOUNTER — Other Ambulatory Visit: Payer: MEDICAID

## 2024-06-13 ENCOUNTER — Other Ambulatory Visit: Payer: MEDICAID

## 2024-06-20 ENCOUNTER — Ambulatory Visit: Payer: MEDICAID

## 2024-06-21 ENCOUNTER — Other Ambulatory Visit: Payer: MEDICAID

## 2024-06-26 ENCOUNTER — Ambulatory Visit: Payer: MEDICAID

## 2024-06-27 ENCOUNTER — Encounter: Payer: MEDICAID | Admitting: Family Medicine
